# Patient Record
Sex: Female | Born: 1945 | Race: White | Hispanic: No | Smoking: Never smoker
Health system: Southern US, Community
[De-identification: ages and names within clinical notes are randomized; demographics above are authoritative.]

## PROBLEM LIST (undated history)

## (undated) DIAGNOSIS — C801 Malignant (primary) neoplasm, unspecified: Secondary | ICD-10-CM

## (undated) DIAGNOSIS — I10 Essential (primary) hypertension: Secondary | ICD-10-CM

## (undated) HISTORY — DX: Malignant (primary) neoplasm, unspecified: C80.1

## (undated) HISTORY — DX: Essential (primary) hypertension: I10

---

## 2012-07-13 ENCOUNTER — Ambulatory Visit (INDEPENDENT_AMBULATORY_CARE_PROVIDER_SITE_OTHER): Payer: Medicare Other | Admitting: Physician Assistant

## 2012-07-13 ENCOUNTER — Ambulatory Visit: Payer: Self-pay | Admitting: Physician Assistant

## 2012-07-13 ENCOUNTER — Encounter: Payer: Self-pay | Admitting: Physician Assistant

## 2012-07-13 VITALS — BP 152/96 | HR 84 | Temp 97.1°F | Resp 16 | Ht 59.75 in | Wt 173.0 lb

## 2012-07-13 DIAGNOSIS — I10 Essential (primary) hypertension: Secondary | ICD-10-CM

## 2012-07-13 LAB — BASIC METABOLIC PANEL WITH GFR
BUN: 17 mg/dL (ref 6–23)
CO2: 27 mEq/L (ref 19–32)
Calcium: 10 mg/dL (ref 8.4–10.5)
Creat: 0.71 mg/dL (ref 0.50–1.10)
GFR, Est African American: >89 mL/min
Glucose, Bld: 154 mg/dL — ABNORMAL HIGH (ref 70–99)

## 2012-07-13 MED ORDER — LISINOPRIL-HYDROCHLOROTHIAZIDE 20-25 MG PO TABS: 1.0000 | ORAL_TABLET | Freq: Every day | ORAL | Status: DC

## 2012-07-13 MED ORDER — NEBIVOLOL HCL 5 MG PO TABS: 5.0000 mg | ORAL_TABLET | Freq: Every day | ORAL | Status: DC

## 2012-07-13 MED ORDER — AMLODIPINE BESYLATE 10 MG PO TABS: 10.0000 mg | ORAL_TABLET | Freq: Every day | ORAL | Status: DC

## 2012-07-13 NOTE — Progress Notes (Signed)
   Patient ID: Evelyn Riley MRN: [id number], DOB: 1945-03-22, 67 y.o. Date of Encounter: 07/13/2012, 5:26 PM    Chief Complaint:  Chief Complaint  Patient presents with  . Medication Refill    BP check     HPI: 67 y.o. year old female here for f/u. Her LOV here was 04/2010. She says she was living in [place], [place] for a while-had care while there. Has continued on same BP meds as she was on at LOV here. LOV there was about 6 months ago. Has had no BP check anywhere since then.    Home Meds: See attached medication section for any medications that were entered at today's visit. The computer does not put those onto this list.The following list is a list of meds entered prior to today's visit.   No current outpatient prescriptions on file prior to visit.   No current facility-administered medications on file prior to visit.    Allergies: No Known Allergies    Review of Systems: See HPI for pertinent ROS. All other ROS negative.    Physical Exam: Blood pressure 152/96, pulse 84, temperature 97.1 F (36.2 C), temperature source Oral, resp. rate 16, height 4' 11.75" (1.518 m), weight 173 lb (78.472 kg)., Body mass index is 34.05 kg/(m^2). General: WNWD WF. ANAD. Neck: No Carotid Bruits. HEART: Reg Rhythm. No murmur, gallop. Lungs: Clear Extrem: No LE Edema.     ASSESSMENT AND PLAN:  67 y.o. year old female with  1. Hypertension BP is suboptimal. Will cont current meds and add Bystolic to them. Will check lab. - nebivolol (BYSTOLIC) 5 MG tablet; Take 1 tablet (5 mg total) by mouth daily.  Dispense: 30 tablet; Refill: 11 - amLODipine (NORVASC) 10 MG tablet; Take 1 tablet (10 mg total) by mouth daily.  Dispense: 30 tablet; Refill: 11 - lisinopril-hydrochlorothiazide (PRINZIDE,ZESTORETIC) 20-25 MG per tablet; Take 1 tablet by mouth daily.  Dispense: 30 tablet; Refill: 11 - BASIC METABOLIC PANEL WITH GFR  2. Family h/o Cardiovascular disease: Father with CAD in 77s. Mother with CVA at  41.  3. HLD: Discussed again today. She still refuses any cholesterol med at all. Therefore refuses to recheck chol lab 4. Still refuses all preventive care as well (Pap, Mammogram, Colonoscopy etc.)   F/U OV 2-3 weeks to recheck BP after med change.  Murray Hodgkins [place], [place], [facility] 07/13/2012 5:26 PM

## 2012-07-24 ENCOUNTER — Encounter: Payer: Self-pay | Admitting: Family Medicine

## 2013-07-23 ENCOUNTER — Other Ambulatory Visit: Payer: Self-pay | Admitting: Physician Assistant

## 2013-07-24 NOTE — Telephone Encounter (Signed)
Refill appropriate and filled per protocol. 

## 2014-02-19 ENCOUNTER — Other Ambulatory Visit: Payer: Self-pay | Admitting: Physician Assistant

## 2014-02-19 ENCOUNTER — Encounter: Payer: Self-pay | Admitting: Family Medicine

## 2014-02-19 NOTE — Telephone Encounter (Signed)
Medication refill for one time only.  Patient needs to be seen.  Letter sent for patient to call and schedule 

## 2017-04-04 ENCOUNTER — Emergency Department (HOSPITAL_COMMUNITY): Payer: Medicare Other

## 2017-04-04 ENCOUNTER — Other Ambulatory Visit: Payer: Self-pay

## 2017-04-04 ENCOUNTER — Encounter (HOSPITAL_COMMUNITY): Payer: Self-pay | Admitting: Radiology

## 2017-04-04 ENCOUNTER — Inpatient Hospital Stay (HOSPITAL_COMMUNITY): Payer: Medicare Other

## 2017-04-04 ENCOUNTER — Inpatient Hospital Stay (HOSPITAL_COMMUNITY)
Admission: EM | Admit: 2017-04-04 | Discharge: 2017-04-07 | DRG: 292 | Disposition: A | Discharge status: Home Health | Payer: Medicare Other | Attending: Internal Medicine | Admitting: Internal Medicine

## 2017-04-04 ENCOUNTER — Emergency Department (HOSPITAL_BASED_OUTPATIENT_CLINIC_OR_DEPARTMENT_OTHER)
Admit: 2017-04-04 | Discharge: 2017-04-04 | Disposition: A | Discharge status: Home / Self Care | Payer: Medicare Other | Attending: Emergency Medicine | Admitting: Emergency Medicine

## 2017-04-04 DIAGNOSIS — C7951 Secondary malignant neoplasm of bone: Secondary | ICD-10-CM | POA: Diagnosis present

## 2017-04-04 DIAGNOSIS — R609 Edema, unspecified: Secondary | ICD-10-CM

## 2017-04-04 DIAGNOSIS — C50011 Malignant neoplasm of nipple and areola, right female breast: Secondary | ICD-10-CM | POA: Diagnosis present

## 2017-04-04 DIAGNOSIS — I5041 Acute combined systolic (congestive) and diastolic (congestive) heart failure: Secondary | ICD-10-CM | POA: Diagnosis not present

## 2017-04-04 DIAGNOSIS — E876 Hypokalemia: Secondary | ICD-10-CM | POA: Diagnosis present

## 2017-04-04 DIAGNOSIS — Z515 Encounter for palliative care: Secondary | ICD-10-CM | POA: Diagnosis not present

## 2017-04-04 DIAGNOSIS — Z7189 Other specified counseling: Secondary | ICD-10-CM

## 2017-04-04 DIAGNOSIS — I255 Ischemic cardiomyopathy: Secondary | ICD-10-CM | POA: Diagnosis present

## 2017-04-04 DIAGNOSIS — I081 Rheumatic disorders of both mitral and tricuspid valves: Secondary | ICD-10-CM | POA: Diagnosis present

## 2017-04-04 DIAGNOSIS — I110 Hypertensive heart disease with heart failure: Secondary | ICD-10-CM | POA: Diagnosis present

## 2017-04-04 DIAGNOSIS — J181 Lobar pneumonia, unspecified organism: Secondary | ICD-10-CM | POA: Diagnosis not present

## 2017-04-04 DIAGNOSIS — R0602 Shortness of breath: Secondary | ICD-10-CM | POA: Diagnosis not present

## 2017-04-04 DIAGNOSIS — F329 Major depressive disorder, single episode, unspecified: Secondary | ICD-10-CM | POA: Diagnosis present

## 2017-04-04 DIAGNOSIS — I5043 Acute on chronic combined systolic (congestive) and diastolic (congestive) heart failure: Secondary | ICD-10-CM | POA: Diagnosis not present

## 2017-04-04 DIAGNOSIS — N6310 Unspecified lump in the right breast, unspecified quadrant: Secondary | ICD-10-CM | POA: Diagnosis not present

## 2017-04-04 DIAGNOSIS — C773 Secondary and unspecified malignant neoplasm of axilla and upper limb lymph nodes: Secondary | ICD-10-CM | POA: Diagnosis present

## 2017-04-04 DIAGNOSIS — C781 Secondary malignant neoplasm of mediastinum: Secondary | ICD-10-CM | POA: Diagnosis present

## 2017-04-04 DIAGNOSIS — I509 Heart failure, unspecified: Secondary | ICD-10-CM | POA: Diagnosis not present

## 2017-04-04 DIAGNOSIS — L8990 Pressure ulcer of unspecified site, unspecified stage: Secondary | ICD-10-CM | POA: Diagnosis present

## 2017-04-04 DIAGNOSIS — Z8249 Family history of ischemic heart disease and other diseases of the circulatory system: Secondary | ICD-10-CM | POA: Diagnosis not present

## 2017-04-04 DIAGNOSIS — Z66 Do not resuscitate: Secondary | ICD-10-CM | POA: Diagnosis present

## 2017-04-04 DIAGNOSIS — Z9114 Patient's other noncompliance with medication regimen: Secondary | ICD-10-CM

## 2017-04-04 DIAGNOSIS — F419 Anxiety disorder, unspecified: Secondary | ICD-10-CM | POA: Diagnosis not present

## 2017-04-04 DIAGNOSIS — I2510 Atherosclerotic heart disease of native coronary artery without angina pectoris: Secondary | ICD-10-CM | POA: Diagnosis present

## 2017-04-04 DIAGNOSIS — N63 Unspecified lump in unspecified breast: Secondary | ICD-10-CM

## 2017-04-04 DIAGNOSIS — I340 Nonrheumatic mitral (valve) insufficiency: Secondary | ICD-10-CM | POA: Diagnosis not present

## 2017-04-04 DIAGNOSIS — I50811 Acute right heart failure: Secondary | ICD-10-CM

## 2017-04-04 LAB — CBC WITH DIFFERENTIAL/PLATELET
Basophils Absolute: 0 10*3/uL (ref 0.0–0.1)
Basophils Relative: 1 %
EOS ABS: 0.1 10*3/uL (ref 0.0–0.7)
Eosinophils Relative: 1 %
HCT: 46.8 % — ABNORMAL HIGH (ref 36.0–46.0)
HEMOGLOBIN: 15.4 g/dL — AB (ref 12.0–15.0)
LYMPHS ABS: 0.7 10*3/uL (ref 0.7–4.0)
LYMPHS PCT: 12 %
MCH: 30.9 pg (ref 26.0–34.0)
MCHC: 32.9 g/dL (ref 30.0–36.0)
MCV: 94 fL (ref 78.0–100.0)
Monocytes Absolute: 0.7 10*3/uL (ref 0.1–1.0)
Monocytes Relative: 11 %
NEUTROS ABS: 4.8 10*3/uL (ref 1.7–7.7)
NEUTROS PCT: 75 %
Platelets: 217 10*3/uL (ref 150–400)
RBC: 4.98 MIL/uL (ref 3.87–5.11)
RDW: 15.8 % — ABNORMAL HIGH (ref 11.5–15.5)
WBC: 6.4 10*3/uL (ref 4.0–10.5)

## 2017-04-04 LAB — URINALYSIS, ROUTINE W REFLEX MICROSCOPIC
BILIRUBIN URINE: NEGATIVE
Glucose, UA: NEGATIVE mg/dL
Hgb urine dipstick: NEGATIVE
KETONES UR: NEGATIVE mg/dL
LEUKOCYTES UA: NEGATIVE
Nitrite: NEGATIVE
PROTEIN: 100 mg/dL — AB
SPECIFIC GRAVITY, URINE: 1.027 (ref 1.005–1.030)
pH: 5 (ref 5.0–8.0)

## 2017-04-04 LAB — HEPATIC FUNCTION PANEL
ALK PHOS: 124 U/L (ref 38–126)
ALT: 22 U/L (ref 14–54)
AST: 26 U/L (ref 15–41)
Albumin: 3.2 g/dL — ABNORMAL LOW (ref 3.5–5.0)
BILIRUBIN INDIRECT: 2 mg/dL — AB (ref 0.3–0.9)
Bilirubin, Direct: 0.6 mg/dL — ABNORMAL HIGH (ref 0.1–0.5)
TOTAL PROTEIN: 7 g/dL (ref 6.5–8.1)
Total Bilirubin: 2.6 mg/dL — ABNORMAL HIGH (ref 0.3–1.2)

## 2017-04-04 LAB — BASIC METABOLIC PANEL
Anion gap: 12 (ref 5–15)
BUN: 10 mg/dL (ref 6–20)
CHLORIDE: 103 mmol/L (ref 101–111)
CO2: 25 mmol/L (ref 22–32)
Calcium: 9.2 mg/dL (ref 8.9–10.3)
Creatinine, Ser: 0.64 mg/dL (ref 0.44–1.00)
GFR calc Af Amer: >60 mL/min (ref >60)
GFR calc non Af Amer: >60 mL/min (ref >60)
Glucose, Bld: 136 mg/dL — ABNORMAL HIGH (ref 65–99)
POTASSIUM: 3.1 mmol/L — AB (ref 3.5–5.1)
SODIUM: 140 mmol/L (ref 135–145)

## 2017-04-04 LAB — BRAIN NATRIURETIC PEPTIDE: B Natriuretic Peptide: 1060.2 pg/mL — ABNORMAL HIGH (ref 0.0–100.0)

## 2017-04-04 MED ORDER — SODIUM CHLORIDE 0.9% FLUSH
3.0000 mL | Freq: Two times a day (BID) | INTRAVENOUS | Status: DC
  Administered 2017-04-05 – 2017-04-07 (×6): 3 mL via INTRAVENOUS

## 2017-04-04 MED ORDER — FUROSEMIDE 10 MG/ML IJ SOLN
40.0000 mg | Freq: Two times a day (BID) | INTRAMUSCULAR | Status: DC
  Administered 2017-04-05 – 2017-04-06 (×3): 40 mg via INTRAVENOUS
  Filled 2017-04-04 (×3): qty 4

## 2017-04-04 MED ORDER — SODIUM CHLORIDE 0.9% FLUSH: 3.0000 mL | INTRAVENOUS | Status: DC | PRN

## 2017-04-04 MED ORDER — ENOXAPARIN SODIUM 40 MG/0.4ML ~~LOC~~ SOLN
40.0000 mg | SUBCUTANEOUS | Status: DC
  Administered 2017-04-05 – 2017-04-07 (×3): 40 mg via SUBCUTANEOUS
  Filled 2017-04-04 (×3): qty 0.4

## 2017-04-04 MED ORDER — ACETAMINOPHEN 325 MG PO TABS
650.0000 mg | ORAL_TABLET | ORAL | Status: DC | PRN
  Administered 2017-04-05: 650 mg via ORAL
  Filled 2017-04-04: qty 2

## 2017-04-04 MED ORDER — POTASSIUM CHLORIDE CRYS ER 20 MEQ PO TBCR
20.0000 meq | EXTENDED_RELEASE_TABLET | Freq: Two times a day (BID) | ORAL | Status: DC
  Administered 2017-04-05 (×3): 20 meq via ORAL
  Filled 2017-04-04 (×3): qty 1

## 2017-04-04 MED ORDER — IRBESARTAN 75 MG PO TABS
75.0000 mg | ORAL_TABLET | Freq: Every day | ORAL | Status: DC
  Administered 2017-04-05 – 2017-04-07 (×3): 75 mg via ORAL
  Filled 2017-04-04 (×3): qty 1

## 2017-04-04 MED ORDER — IOPAMIDOL (ISOVUE-370) INJECTION 76%
INTRAVENOUS | Status: AC
  Administered 2017-04-04: 100 mL
  Filled 2017-04-04: qty 100

## 2017-04-04 MED ORDER — ASPIRIN EC 81 MG PO TBEC
81.0000 mg | DELAYED_RELEASE_TABLET | Freq: Every day | ORAL | Status: DC
  Administered 2017-04-05 – 2017-04-07 (×3): 81 mg via ORAL
  Filled 2017-04-04 (×3): qty 1

## 2017-04-04 MED ORDER — METOPROLOL SUCCINATE ER 25 MG PO TB24
25.0000 mg | ORAL_TABLET | Freq: Every day | ORAL | Status: DC
  Administered 2017-04-05 – 2017-04-07 (×3): 25 mg via ORAL
  Filled 2017-04-04 (×3): qty 1

## 2017-04-04 MED ORDER — POTASSIUM CHLORIDE CRYS ER 20 MEQ PO TBCR
40.0000 meq | EXTENDED_RELEASE_TABLET | Freq: Once | ORAL | Status: AC
  Administered 2017-04-04: 40 meq via ORAL
  Filled 2017-04-04: qty 2

## 2017-04-04 MED ORDER — MAGNESIUM SULFATE 2 GM/50ML IV SOLN
2.0000 g | Freq: Once | INTRAVENOUS | Status: AC
  Administered 2017-04-05: 2 g via INTRAVENOUS
  Filled 2017-04-04: qty 50

## 2017-04-04 MED ORDER — ONDANSETRON HCL 4 MG/2ML IJ SOLN: 4.0000 mg | Freq: Four times a day (QID) | INTRAMUSCULAR | Status: DC | PRN

## 2017-04-04 MED ORDER — FUROSEMIDE 10 MG/ML IJ SOLN
20.0000 mg | Freq: Once | INTRAMUSCULAR | Status: AC
  Administered 2017-04-04: 20 mg via INTRAVENOUS
  Filled 2017-04-04: qty 2

## 2017-04-04 MED ORDER — SODIUM CHLORIDE 0.9 % IV SOLN: 250.0000 mL | INTRAVENOUS | Status: DC | PRN

## 2017-04-04 MED ORDER — ONDANSETRON HCL 4 MG/2ML IJ SOLN
4.0000 mg | Freq: Once | INTRAMUSCULAR | Status: AC
  Administered 2017-04-04: 4 mg via INTRAVENOUS
  Filled 2017-04-04: qty 2

## 2017-04-04 NOTE — Progress Notes (Signed)
Bilateral lower extremity venous duplex has been completed. Negative for obvious evidence of DVT. Results were given to [facility].  04/04/17 4:03 PM Carlos Levering RVT

## 2017-04-04 NOTE — ED Provider Notes (Signed)
Patient placed in Quick Look pathway, seen and evaluated   Chief Complaint: BLE swelling x2 months  HPI:   Pt and her son report that she's had BLE swelling for "a while" but never as bad as the last 2 months, when it's been getting more erythematous and the skin has been opening up and weeping a yellowish clear fluid. She denies pain in the legs, denies CP/SOB, fevers, or warmth to the legs. She hasn't been to a doctor in "a while" so she has no known medical problems aside from HTN, denies being previously diagnosed with CHF or DM2 but states she isn't sure since it's been so long since she was seen by a doctor.   ROS: +LE swelling, +BLE erythema and weeping, no leg pain, no CP, SOB, fevers, or warmth of the legs  Physical Exam:   Gen: No distress  Neuro: Awake and Alert  Skin: Warm    Focused Exam: BLE with 3+ pitting edema, somewhat brawny skin to LLE with scant erythema, RLE with weeping openings in the leg, and +erythema, slightly warm to touch, nontender to either leg   Initiation of care has begun. The patient has been counseled on the process, plan, and necessity for staying for the completion/evaluation, and the remainder of the medical screening examination    [address] [place], [place] 04/04/17 1412    Hayden Rasmussen, MD 04/06/17 1141

## 2017-04-04 NOTE — ED Triage Notes (Addendum)
Pt arrives to ED with son with extreme lower leg swelling into feet worse on the right, pt had redness and weeping. Pt states she "hasnt seen a doctor in years".

## 2017-04-04 NOTE — ED Provider Notes (Signed)
[facility] EMERGENCY DEPARTMENT Provider Note   CSN: [id number] Arrival date & time: 04/04/17  1332     History   Chief Complaint Chief Complaint  Patient presents with  . Leg Swelling    HPI Evelyn Riley is a 72 y.o. female.  Patient with history of hypertension, noncompliant with medications or routine health care --brought in by son with complaint of bilateral lower extremity edema.  Edema has been getting worse since about Christmas.  Several days ago son noted that the patient had draining from her legs.  No fevers, nausea, vomiting, diarrhea.  She denies any pain.  She has shortness of breath at times but states it is not particularly worse recently.  She sleeps sitting up and states that she has done this for a long time.  Son is concerned because the patient does not address her health issues and he also feels that she has been very depressed for some time does not name but sit at home.        Past Medical History:  Diagnosis Date  . Hypertension     Patient Active Problem List   Diagnosis Date Noted  . Hypertension     The histories are not reviewed yet. Please review them in the "History" navigator section and refresh this Dinosaur.  OB History    No data available       Home Medications    Prior to Admission medications   Medication Sig Start Date End Date Taking? Authorizing Provider  ibuprofen (ADVIL,MOTRIN) 200 MG tablet Take 400 mg by mouth every 6 (six) hours as needed.   Yes [provider]  amLODipine (NORVASC) 10 MG tablet TAKE ONE TABLET BY MOUTH ONCE DAILY Patient not taking: Reported on 04/04/2017 02/19/14   Orlena Sheldon, PA-C  lisinopril-hydrochlorothiazide (PRINZIDE,ZESTORETIC) 20-25 MG per tablet TAKE ONE TABLET BY MOUTH ONCE DAILY Patient not taking: Reported on 04/04/2017 02/19/14   Dena Billet B, PA-C  nebivolol (BYSTOLIC) 5 MG tablet Take 1 tablet (5 mg total) by mouth daily. Patient not taking: Reported on  04/04/2017 07/13/12   Orlena Sheldon, PA-C    Family History No family history on file.  Social History Social History   Tobacco Use  . Smoking status: Never Smoker  . Smokeless tobacco: Never Used  Substance Use Topics  . Alcohol use: No  . Drug use: Yes     Allergies   Patient has no known allergies.   Review of Systems Review of Systems  Constitutional: Negative for diaphoresis and fever.  HENT: Negative for rhinorrhea and sore throat.   Eyes: Negative for redness.  Respiratory: Positive for shortness of breath (intermittent). Negative for cough.   Cardiovascular: Positive for leg swelling. Negative for chest pain and palpitations.  Gastrointestinal: Negative for abdominal pain, diarrhea, nausea and vomiting.  Genitourinary: Negative for dysuria.  Musculoskeletal: Negative for back pain, myalgias and neck pain.  Skin: Negative for rash.  Neurological: Negative for syncope, light-headedness and headaches.  Psychiatric/Behavioral: The patient is not nervous/anxious.      Physical Exam Updated Vital Signs BP (!) 185/105   Pulse 91   Temp 98.4 F (36.9 C) (Oral)   Resp 17   Ht 5\' 1"  (1.549 m)   SpO2 96%   BMI 32.69 kg/m   Physical Exam  Constitutional: She appears well-developed and well-nourished.  HENT:  Head: Normocephalic and atraumatic.  Mouth/Throat: Oropharynx is clear and moist.  Eyes: Conjunctivae are normal. Right eye exhibits no  discharge. Left eye exhibits no discharge.  Neck: Normal range of motion. Neck supple.  Cardiovascular: Normal rate and regular rhythm.  No murmur heard. Pulmonary/Chest: Effort normal and breath sounds normal. No respiratory distress. She has no wheezes.  Abdominal: Soft. There is no tenderness. There is no rebound and no guarding.  Musculoskeletal: She exhibits edema. She exhibits no tenderness.  3-4+ edema bilateral lower extremities to the knees with active serous drainage.  Skin is erythematous but not warm and well  demarcated and I have low suspicion for cellulitis.  No purulent drainage.  Neurological: She is alert.  Skin: Skin is warm and dry.  Psychiatric: She has a normal mood and affect.  Nursing note and vitals reviewed.    ED Treatments / Results  Labs (all labs ordered are listed, but only abnormal results are displayed) Labs Reviewed  CBC WITH DIFFERENTIAL/PLATELET - Abnormal; Notable for the following components:      Result Value   Hemoglobin 15.4 (*)    HCT 46.8 (*)    RDW 15.8 (*)    All other components within normal limits  BASIC METABOLIC PANEL - Abnormal; Notable for the following components:   Potassium 3.1 (*)    Glucose, Bld 136 (*)    All other components within normal limits  HEPATIC FUNCTION PANEL - Abnormal; Notable for the following components:   Albumin 3.2 (*)    Total Bilirubin 2.6 (*)    Bilirubin, Direct 0.6 (*)    Indirect Bilirubin 2.0 (*)    All other components within normal limits  BRAIN NATRIURETIC PEPTIDE - Abnormal; Notable for the following components:   B Natriuretic Peptide 1,060.2 (*)    All other components within normal limits  URINALYSIS, ROUTINE W REFLEX MICROSCOPIC - Abnormal; Notable for the following components:   Protein, ur 100 (*)    Bacteria, UA RARE (*)    Squamous Epithelial / LPF 0-5 (*)    All other components within normal limits  HEPATIC FUNCTION PANEL  BRAIN NATRIURETIC PEPTIDE  TROPONIN I    EKG  EKG Interpretation None       Radiology Vas [place] Lower Extremity Venous (dvt) (only [facility])  Result Date: 04/04/2017  Lower Venous Study Indication: Edema. Examination Guidelines: A complete evaluation includes B-mode imaging, spectral doppler, color doppler, and power doppler as needed of all accessible portions of each vessel. Bilateral testing is considered an integral part of a complete examination. Limited examinations for reoccurring indications may be performed as noted. The reflux portion of the exam is performed with  the patient in reverse Trendelenburg.  Limitations: Poor ultrasound/tissue interface.  Right Venous Findings: +---------+---------------+---------+-----------+----------+-------+          CompressibilityPhasicitySpontaneityPropertiesSummary +---------+---------------+---------+-----------+----------+-------+ CFV      Full           Yes      Yes                          +---------+---------------+---------+-----------+----------+-------+ FV Prox  Full                                                 +---------+---------------+---------+-----------+----------+-------+ FV Mid   Full                                                 +---------+---------------+---------+-----------+----------+-------+  FV DistalFull                                                 +---------+---------------+---------+-----------+----------+-------+ PFV      Full                                                 +---------+---------------+---------+-----------+----------+-------+ POP      Full           Yes      Yes                          +---------+---------------+---------+-----------+----------+-------+ PTV      Full                                                 +---------+---------------+---------+-----------+----------+-------+ PERO     Full                                                 +---------+---------------+---------+-----------+----------+-------+  Left Venous Findings: +---------+---------------+---------+-----------+----------+-------------------+          CompressibilityPhasicitySpontaneityPropertiesSummary             +---------+---------------+---------+-----------+----------+-------------------+ CFV      Full           Yes      Yes                                      +---------+---------------+---------+-----------+----------+-------------------+ FV Prox  Full                                                              +---------+---------------+---------+-----------+----------+-------------------+ FV Mid   Full                                                             +---------+---------------+---------+-----------+----------+-------------------+ FV DistalFull                                                             +---------+---------------+---------+-----------+----------+-------------------+ PFV      Full                                                             +---------+---------------+---------+-----------+----------+-------------------+  POP      Full           Yes      Yes                                      +---------+---------------+---------+-----------+----------+-------------------+ PTV                                                   Unable to visualize +---------+---------------+---------+-----------+----------+-------------------+ PERO                                                  Unable to visualize +---------+---------------+---------+-----------+----------+-------------------+    Final Interpretation: Right: There is no evidence of deep vein thrombosis in the lower extremity.There is no evidence of superficial venous thrombosis. No cystic structure found in the popliteal fossa. Left: There is no evidence of deep vein thrombosis in the lower extremity.There is no evidence of deep vein thrombosis in the lower extremity. However, portions of this examination were limited- see technologist comments above. No cystic structure found in the popliteal fossa.  *See table(s) above for measurements and observations. Electronically signed by Deitra Mayo on 04/04/2017 at 5:01:10 PM.   Final   Procedures Procedures (including critical care time)  Medications Ordered in ED Medications  iopamidol (ISOVUE-370) 76 % injection (not administered)  potassium chloride SA (K-DUR,KLOR-CON) CR tablet 40 mEq (40 mEq Oral Given 04/04/17 1827)  furosemide (LASIX)  injection 20 mg (20 mg Intravenous Given 04/04/17 2058)  ondansetron (ZOFRAN) injection 4 mg (4 mg Intravenous Given 04/04/17 2058)     Initial Impression / Assessment and Plan / ED Course  I have reviewed the triage vital signs and the nursing notes.  Pertinent labs & imaging results that were available during my care of the patient were reviewed by me and considered in my medical decision making (see chart for details).     Patient seen and examined. Work-up initiated. Medications ordered. Patient discussed with Dr. Laverta Baltimore. Patient is apprehensive about staying for further workup.  After discussion, she agrees to blood work.  Vital signs reviewed and are as follows: BP (!) 145/130 (BP Location: Right Arm)   Pulse (!) 108   Temp 99.1 F (37.3 C) (Oral)   Resp 14   Ht 5\' 1"  (1.549 m)   SpO2 97%   BMI 32.69 kg/m   Reviewed lab work with patient and son at bedside.  We discussed findings of chest x-ray and BNP.  Strongly advised that she agreed to admission to hospital for further workup of possible new onset congestive heart failure.  Patient agrees to stay.  I spoke with Dr. Posey Pronto who will see patient.  CT ordered to further evaluate chest x-ray findings.  Lasix ordered.  Potassium given.  10:59 PM Spoke with radiologist regarding CT findings, patient with likely breast CA with lymphangitic spread and bony involvement.   I updated patient and family at bedside. Dr. Posey Pronto present.    Final Clinical Impressions(s) / ED Diagnoses   Final diagnoses:  Acute right-sided congestive heart failure (HCC)  Hypokalemia  Mass of right breast   Patient with bilateral  lower extremity edema, elevated BNP, abnormal findings on chest x-ray.  She will need workup.   ED Discharge Orders    None       Carlisle Cater, Hershal Coria 04/04/17 2301    Margette Fast, MD 04/04/17 [phone]

## 2017-04-04 NOTE — ED Notes (Signed)
Pt's SpO2 noted to be varying between 80-94% with a good waveform the bulk of the time. Pt continues to deny SOB. Placed on 2Lpm via [place] for support.

## 2017-04-04 NOTE — ED Notes (Signed)
Patient transported to CT 

## 2017-04-04 NOTE — H&P (Signed)
[facility] History and Physical   Patient: Evelyn Riley QVZ:[id number]   PCP: Orlena Sheldon, PA-C DOB: 1946-01-21   DOA: 04/04/2017   DOS: 04/04/2017   DOS: the patient was seen and examined on 04/04/2017  Patient coming from: The patient is coming from home.  Chief Complaint: leg swelling  HPI: Evelyn Riley is a 72 y.o. female with no known past medical history . Patient was brought in by family due to concern regarding swelling of the leg. This leg swelling has been ongoing for last few months progressively worsening.  Patient also has mild shortness of breath.  Had an episode of nausea and vomiting in the ER.  Denies any further nausea at the time of my evaluation.  No abdominal pain.  No fever no chills.  No diarrhea no burning urination. Does not take any medication does not see any PCP at her baseline.  Denies any alcohol or drug abuse. Denies any smoking as well. Per son she has lost a significant amount of weight.  ED Course: Shortness of breath as well as leg swelling was diagnosed with CHF and patient was given IV Lasix. 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  Patient was referred for admission.  At her baseline ambulates with support And is independent for most of her ADL; manages her medication on her own.  Review of Systems: as mentioned in the history of present illness.  All other systems reviewed and are negative.  Past Medical History:  Diagnosis Date  . Hypertension    History reviewed. No pertinent surgical history. Social History:  reports that  has never smoked. she has never used smokeless tobacco. She reports that she does not drink alcohol or use drugs.  No Known Allergies Family History  Problem Relation Age of Onset  . Hypertension Mother      Prior to Admission medications   Medication Sig  Start Date End Date Taking? Authorizing Provider  ibuprofen (ADVIL,MOTRIN) 200 MG tablet Take 400 mg by mouth every 6 (six) hours as needed.   Yes [provider]    Physical Exam: Vitals:   04/04/17 2115 04/04/17 2145 04/04/17 2215 04/04/17 2300  BP:    (!) 166/87  Pulse: (!) 104 (!) 110 93 (!) 103  Resp: 17 16 (!) 29 (!) 23  Temp:      TempSrc:      SpO2: 96% 92% 93% 90%  Height:        General: Alert, Awake and Oriented to Time, Place and Person. Appear in moderate distress, affect appropriate Eyes: PERRL, Conjunctiva normal ENT: Oral Mucosa clear moist. Neck: positive JVD, no Abnormal Mass Or lumps Cardiovascular: S1 and S2 Present, no Murmur, Peripheral Pulses Present Respiratory: normal respiratory effort, Bilateral Air entry equal and Decreased, no use of accessory muscle, bilateral basal Crackles, no wheezes Abdomen: Bowel Sound present, Soft and no tenderness, no hernia Skin: no redness, no Rash, no induration Extremities: bilateral Pedal edema, no calf tenderness Neurologic: Grossly no focal neuro deficit. Bilaterally Equal motor strength  Labs on Admission:  CBC: Recent Labs  Lab 04/04/17 1410  WBC 6.4  NEUTROABS 4.8  HGB 15.4*  HCT 46.8*  MCV 94.0  PLT 329   Basic Metabolic Panel: Recent Labs  Lab 04/04/17 1410  NA 140  K 3.1*  CL 103  CO2 25  GLUCOSE 136*  BUN  10  CREATININE 0.64  CALCIUM 9.2   GFR: CrCl cannot be calculated (Unknown ideal weight.). Liver Function Tests: Recent Labs  Lab 04/04/17 1410  AST 26  ALT 22  ALKPHOS 124  BILITOT 2.6*  PROT 7.0  ALBUMIN 3.2*   No results for input(s): LIPASE, AMYLASE in the last 168 hours. No results for input(s): AMMONIA in the last 168 hours. Coagulation Profile: No results for input(s): INR, PROTIME in the last 168 hours. Cardiac Enzymes: No results for input(s): CKTOTAL, CKMB, CKMBINDEX, TROPONINI in the last 168 hours. BNP (last 3 results) No results for input(s): PROBNP in  the last 8760 hours. HbA1C: No results for input(s): HGBA1C in the last 72 hours. CBG: No results for input(s): GLUCAP in the last 168 hours. Lipid Profile: No results for input(s): CHOL, HDL, LDLCALC, TRIG, CHOLHDL, LDLDIRECT in the last 72 hours. Thyroid Function Tests: No results for input(s): TSH, T4TOTAL, FREET4, T3FREE, THYROIDAB in the last 72 hours. Anemia Panel: No results for input(s): VITAMINB12, FOLATE, FERRITIN, TIBC, IRON, RETICCTPCT in the last 72 hours. Urine analysis:    Component Value Date/Time   COLORURINE YELLOW 04/04/2017 1841   APPEARANCEUR CLEAR 04/04/2017 1841   LABSPEC 1.027 04/04/2017 1841   PHURINE 5.0 04/04/2017 1841   GLUCOSEU NEGATIVE 04/04/2017 1841   HGBUR NEGATIVE 04/04/2017 1841   BILIRUBINUR NEGATIVE 04/04/2017 [place] NEGATIVE 04/04/2017 1841   PROTEINUR 100 (A) 04/04/2017 1841   NITRITE NEGATIVE 04/04/2017 1841   LEUKOCYTESUR NEGATIVE 04/04/2017 1841    Radiological Exams on Admission: Dg Chest 2 View  Result Date: 04/04/2017 CLINICAL DATA:  Lower extremity edema.  Hypertension. EXAM: CHEST  2 VIEW COMPARISON:  None. FINDINGS: There is consolidation throughout portions of the right middle and lower lobe with small right pleural effusion. The left lung is clear. There is cardiomegaly with pulmonary venous hypertension. There is soft tissue prominence in the right hilum concerning for adenopathy. There is aortic atherosclerosis. There is an old fracture of the right posterolateral seventh rib. No blastic or lytic bone lesions are evident. IMPRESSION: 1. Consolidation throughout much of the right middle lobe as well as in portions of the right lower lobe with right pleural effusion. Suspect pneumonia. 2.  Pulmonary vascular congestion. 3. Prominence of the right hilar region. Question a degree of adenopathy. This finding may well warrant contrast enhanced chest CT to further evaluate. 4.  Aortic atherosclerosis. Aortic Atherosclerosis  (ICD10-I70.0). Electronically Signed   By: Lowella Grip III M.D.   On: 04/04/2017 19:59   Ct Angio Chest Pe W And/or Wo Contrast  Addendum Date: 04/04/2017   ADDENDUM REPORT: 04/04/2017 23:14 ADDENDUM: Lucent and sclerotic appearance of the right first rib, may reflect additional osseous metastatic focus. Electronically Signed   By: Donavan Foil M.D.   On: 04/04/2017 23:14   Result Date: 04/04/2017 CLINICAL DATA:  Shortness of breath for 1 day EXAM: CT ANGIOGRAPHY CHEST WITH CONTRAST TECHNIQUE: Multidetector CT imaging of the chest was performed using the standard protocol during bolus administration of intravenous contrast. Multiplanar CT image reconstructions and MIPs were obtained to evaluate the vascular anatomy. CONTRAST:  110 mL Isovue 370 intravenous COMPARISON:  Radiograph 04/04/2017 FINDINGS: Cardiovascular: Satisfactory opacification of the pulmonary arteries to the segmental level. No evidence of pulmonary embolism. Nonaneurysmal aorta. Moderate aortic atherosclerosis. Mild coronary artery calcification. Cardiomegaly with trace pericardial effusion. Mediastinum/Nodes: Midline trachea. No thyroid mass. Enlarged mediastinal lymph nodes. Right pretracheal lymph node measures 12 mm. Right precarinal lymph node measures 16 mm.  Esophagus within normal limits. Multiple enlarged right axillary and subpectoral lymph nodes, measuring up to 3.5 cm. Lungs/Pleura: Moderate right-sided pleural effusion. Mild atelectasis in the right middle lobe and right lung base. No pneumothorax. Upper Abdomen: Partially visualized hypodensity in the posterior right hepatic lobe. Adrenal glands are within normal limits. Musculoskeletal: Large sub areolar mass in the right breast with suggestion of nipple retraction.Mass measures approximately 5.6 x 2.9 by 7.2 cm. Bones demonstrate lucent lesion in the T1 vertebral body, mixed lucency and sclerosis at T2, T8, T11, and T12. Review of the MIP images confirms the above  findings. IMPRESSION: 1. Negative for acute pulmonary embolus. Cardiomegaly with moderate right-sided pleural effusion. 2. Large right subareolar breast mass concerning for breast carcinoma. Overlying nipple retraction. Multiple enlarged right axillary and subpectoral lymph nodes concerning for metastatic disease. 3. Enlarged mediastinal lymph nodes, suspect for metastatic disease 4. Multiple lucent and mixed lucent and sclerotic lesions within the spine as above concerning for osseous metastatic disease. Critical Value/emergent results were called by telephone at the time of interpretation on 04/04/2017 at 10:50 pm to Dr. Carlisle Cater , who verbally acknowledged these results. Aortic Atherosclerosis (ICD10-I70.0). Electronically Signed: By: Donavan Foil M.D. On: 04/04/2017 22:50   Vas [place] Lower Extremity Venous (dvt) (only [facility])  Result Date: 04/04/2017  Lower Venous Study Indication: Edema. Examination Guidelines: A complete evaluation includes B-mode imaging, spectral doppler, color doppler, and power doppler as needed of all accessible portions of each vessel. Bilateral testing is considered an integral part of a complete examination. Limited examinations for reoccurring indications may be performed as noted. The reflux portion of the exam is performed with the patient in reverse Trendelenburg.  Limitations: Poor ultrasound/tissue interface.  Right Venous Findings: +---------+---------------+---------+-----------+----------+-------+          CompressibilityPhasicitySpontaneityPropertiesSummary +---------+---------------+---------+-----------+----------+-------+ CFV      Full           Yes      Yes                          +---------+---------------+---------+-----------+----------+-------+ FV Prox  Full                                                 +---------+---------------+---------+-----------+----------+-------+ FV Mid   Full                                                  +---------+---------------+---------+-----------+----------+-------+ FV DistalFull                                                 +---------+---------------+---------+-----------+----------+-------+ PFV      Full                                                 +---------+---------------+---------+-----------+----------+-------+ POP      Full           Yes      Yes                          +---------+---------------+---------+-----------+----------+-------+  PTV      Full                                                 +---------+---------------+---------+-----------+----------+-------+ PERO     Full                                                 +---------+---------------+---------+-----------+----------+-------+  Left Venous Findings: +---------+---------------+---------+-----------+----------+-------------------+          CompressibilityPhasicitySpontaneityPropertiesSummary             +---------+---------------+---------+-----------+----------+-------------------+ CFV      Full           Yes      Yes                                      +---------+---------------+---------+-----------+----------+-------------------+ FV Prox  Full                                                             +---------+---------------+---------+-----------+----------+-------------------+ FV Mid   Full                                                             +---------+---------------+---------+-----------+----------+-------------------+ FV DistalFull                                                             +---------+---------------+---------+-----------+----------+-------------------+ PFV      Full                                                             +---------+---------------+---------+-----------+----------+-------------------+ POP      Full           Yes      Yes                                       +---------+---------------+---------+-----------+----------+-------------------+ PTV                                                   Unable to visualize +---------+---------------+---------+-----------+----------+-------------------+ PERO  Unable to visualize +---------+---------------+---------+-----------+----------+-------------------+    Final Interpretation: Right: There is no evidence of deep vein thrombosis in the lower extremity.There is no evidence of superficial venous thrombosis. No cystic structure found in the popliteal fossa. Left: There is no evidence of deep vein thrombosis in the lower extremity.There is no evidence of deep vein thrombosis in the lower extremity. However, portions of this examination were limited- see technologist comments above. No cystic structure found in the popliteal fossa.  *See table(s) above for measurements and observations. Electronically signed by Deitra Mayo on 04/04/2017 at 5:01:10 PM.   Final   Assessment/Plan 1.  Acute CHF. Specificity is not clear but most likely diastolic, right-sided CHF. Echocardiogram ordered. EKG unremarkable for any ischemia. Troponin negative as well. Continue IV Lasix 40 mg twice daily. Added ARB as well as Toprol-XL. Monitor on telemetry. PT OT evaluation.  2.  Right breast lesion. CT scan of the chest is positive for right breast mass with lymphadenopathy as well as involvement of T1 spine. This most likely is consistent with metastatic breast cancer. Discussed with patient as well as family, patient mentions that she does not want any chemotherapy or immunotherapy. Will discuss with oncology tomorrow for further option. Most likely best option would be palliative care given patient's current presentation.  3.  Mild hypokalemia. Will replace daily potassium.  4.  Depression. Patient shows an exhibit signs of depression with weight loss as well as  flat affect. No benefit from the medication. Will monitor overnight and started on probably Zoloft or Paxil tomorrow.  5.  Bilateral leg edema as well as elevated d-dimer. Lower externally Doppler as well as CT scan negative for PE and DVT. Monitor.  Nutrition: cardiac diet DVT Prophylaxis: subcutaneous Heparin  Advance goals of care discussion: full code   Consults: none  Family Communication: family was present at bedside, at the time of interview.  Opportunity was given to ask question and all questions were answered satisfactorily.  Disposition: Admitted as inpatient, telemetry nit. Likely to be discharged home, in 3-4 days.  Author: Berle Mull, MD [facility] Pager: [phone] 04/04/2017  If 7PM-7AM, please contact night-coverage [email or web address] Password [email or web address]

## 2017-04-04 NOTE — ED Notes (Signed)
After ambulating to bathroom in hallway, pt denies SOB, but is obviously having labored breathing, tachypnic, and tachycardic. SpO2 >93%. Max HR observed upon return to room, 125bpm. Breathing slowed upon return to rest.

## 2017-04-04 NOTE — ED Notes (Signed)
Admitting Provider at bedside. 

## 2017-04-04 NOTE — ED Notes (Signed)
Patient transported to X-ray 

## 2017-04-05 ENCOUNTER — Other Ambulatory Visit: Payer: Self-pay

## 2017-04-05 ENCOUNTER — Inpatient Hospital Stay (HOSPITAL_COMMUNITY): Payer: Medicare Other

## 2017-04-05 ENCOUNTER — Encounter (HOSPITAL_COMMUNITY): Payer: Self-pay | Admitting: Internal Medicine

## 2017-04-05 DIAGNOSIS — L8990 Pressure ulcer of unspecified site, unspecified stage: Secondary | ICD-10-CM

## 2017-04-05 DIAGNOSIS — I340 Nonrheumatic mitral (valve) insufficiency: Secondary | ICD-10-CM

## 2017-04-05 DIAGNOSIS — I5041 Acute combined systolic (congestive) and diastolic (congestive) heart failure: Secondary | ICD-10-CM

## 2017-04-05 LAB — BASIC METABOLIC PANEL
Anion gap: 15 (ref 5–15)
BUN: 7 mg/dL (ref 6–20)
CHLORIDE: 101 mmol/L (ref 101–111)
CO2: 26 mmol/L (ref 22–32)
Calcium: 8.9 mg/dL (ref 8.9–10.3)
Creatinine, Ser: 0.68 mg/dL (ref 0.44–1.00)
GFR calc Af Amer: >60 mL/min (ref >60)
GFR calc non Af Amer: >60 mL/min (ref >60)
GLUCOSE: 150 mg/dL — AB (ref 65–99)
POTASSIUM: 3.6 mmol/L (ref 3.5–5.1)
Sodium: 142 mmol/L (ref 135–145)

## 2017-04-05 LAB — CBC
HCT: 44.1 % (ref 36.0–46.0)
HEMOGLOBIN: 14.2 g/dL (ref 12.0–15.0)
MCH: 30.1 pg (ref 26.0–34.0)
MCHC: 32.2 g/dL (ref 30.0–36.0)
MCV: 93.6 fL (ref 78.0–100.0)
Platelets: 223 10*3/uL (ref 150–400)
RBC: 4.71 MIL/uL (ref 3.87–5.11)
RDW: 15.7 % — ABNORMAL HIGH (ref 11.5–15.5)
WBC: 14.4 10*3/uL — AB (ref 4.0–10.5)

## 2017-04-05 LAB — ECHOCARDIOGRAM COMPLETE
Height: 61 in
Weight: 2289.6 oz

## 2017-04-05 LAB — MAGNESIUM: MAGNESIUM: 2.1 mg/dL (ref 1.7–2.4)

## 2017-04-05 NOTE — Progress Notes (Signed)
Entered patient's room to recheck blood pressure and administer medications.   Pt informed of NPO status, and to avoid fast food when she isn't on NPO status, as the [employer] breakfast at bedside is full of sodium and could worsen her condition.  Pt states she doesn't need to be NPO today, as she is refusing the biopsy today. Does not indicate a permanent refusal; just that she would not like to do it today.   Pt's son is at bedside, he expresses frustration with the patient and storms out of the room.   Upon telling the patient I would return to check her BP once things have calmed down, and exiting the room the patient's son indicates that he is POA and would like his paperwork on file. Pt's son was reminded that the patient is c/a/ox4 and is of sound mind therefore he cannot override her wishes; at which point he states that he can get someone to say she isn't. Son is reminded that pt is in fact very aware of the situation, and that it is her right to make these difficult decisions how she wishes.

## 2017-04-05 NOTE — Progress Notes (Signed)
   04/05/17 1300  Clinical Encounter Type  Visited With Patient and family together  Visit Type Spiritual support;Social support;Follow-up  Referral From Nurse  Consult/Referral To Chaplain  Spiritual Encounters  Spiritual Needs Emotional  Stress Factors  Patient Stress Factors Major life changes  Family Stress Factors Major life changes  Chaplain visited with the family as a means to introduce the paperwork for Advanced Directives.  The PT was alert and oriented, PT did not want AD paperwork but DNR.  Chaplain spoke with the Unit Director and make it known that PT wants DNR, doctor will be notified.

## 2017-04-05 NOTE — Progress Notes (Signed)
OT Cancellation Note  Patient Details Name: Evelyn Riley MRN: [id number] DOB: [birth year]   Cancelled Treatment:    Reason Eval/Treat Not Completed: Other (comment). Per conversation with nurse, pt and son are currently processing recent cancer diagnosis very emotional at this time.   Tyrone Schimke OTR/L Pager: [phone]   04/05/2017, 12:53 PM

## 2017-04-05 NOTE — Evaluation (Signed)
Physical Therapy Evaluation & Discharge Patient Details Name: Evelyn Riley MRN: [id number] DOB: March 16, 1945 Today's Date: 04/05/2017   History of Present Illness  Pt is a 72 y.o. female admitted 04/04/17 with BLE swelling; worked up for CHF exacerbation. CT and LE doppler negative for PE and DVT. Chest CT showed R breast mass with T1 spine involvement, consistent with metastatic breast cancer; pt declining cehmo and immunotherapy. PMH includes HTN.    Clinical Impression  Patient evaluated by Physical Therapy with no further acute PT needs identified. PTA, pt indep with mobility and lives alone; son lives nearby and intermittently assist pt with driving. Pt currently indep with ambulation with no apparent balance deficits. Education on energy conservation, fall risk reduction, and importance of continued mobility. Pt states son has access to DME if needed. All education has been completed and the patient has no further questions. Pt anxious to return home. PT is signing off. Thank you for this referral.    Follow Up Recommendations No PT follow up;Supervision - Intermittent    Equipment Recommendations  None recommended by PT    Recommendations for Other Services       Precautions / Restrictions Precautions Precautions: None Restrictions Weight Bearing Restrictions: No      Mobility  Bed Mobility Overal bed mobility: Independent                Transfers Overall transfer level: Independent Equipment used: None                Ambulation/Gait Ambulation/Gait assistance: Independent Ambulation Distance (Feet): 350 Feet Assistive device: None Gait Pattern/deviations: Step-through pattern;Decreased stride length Gait velocity: Decreased Gait velocity interpretation: <1.8 ft/sec, indicative of risk for recurrent falls General Gait Details: Slow, controlled amb indep  Stairs Stairs: Yes Stairs assistance: Modified independent (Device/Increase time) Stair Management:  One rail Right;Backwards Number of Stairs: 10 General stair comments: Simulated ascending steps by high marching with single UE support on R-rail  Wheelchair Mobility    Modified Rankin (Stroke Patients Only)       Balance Overall balance assessment: No apparent balance deficits (not formally assessed)                                           Pertinent Vitals/Pain Pain Assessment: No/denies pain    Home Living Family/patient expects to be discharged to:: Private residence Living Arrangements: Alone Available Help at Discharge: Family;Available PRN/intermittently Type of Home: House Home Access: Stairs to enter Entrance Stairs-Rails: Right Entrance Stairs-Number of Steps: 4 Home Layout: One level   Additional Comments: Son's family lives nearby; has access to DME if needed    Prior Function Level of Independence: Independent         Comments: Drives short distances; son assists with longer trips/errands if needed. Indep with amb and ADLs     Hand Dominance        Extremity/Trunk Assessment   Upper Extremity Assessment Upper Extremity Assessment: Overall WFL for tasks assessed    Lower Extremity Assessment Lower Extremity Assessment: Overall WFL for tasks assessed       Communication   Communication: No difficulties  Cognition Arousal/Alertness: Awake/alert Behavior During Therapy: WFL for tasks assessed/performed Overall Cognitive Status: Within Functional Limits for tasks assessed  General Comments      Exercises     Assessment/Plan    PT Assessment Patent does not need any further PT services  PT Problem List         PT Treatment Interventions      PT Goals (Current goals can be found in the Care Plan section)  Acute Rehab PT Goals PT Goal Formulation: All assessment and education complete, DC therapy    Frequency     Barriers to discharge         Co-evaluation               AM-PAC PT "6 Clicks" Daily Activity  Outcome Measure Difficulty turning over in bed (including adjusting bedclothes, sheets and blankets)?: None Difficulty moving from lying on back to sitting on the side of the bed? : None Difficulty sitting down on and standing up from a chair with arms (e.g., wheelchair, bedside commode, etc,.)?: None Help needed moving to and from a bed to chair (including a wheelchair)?: None Help needed walking in hospital room?: None Help needed climbing 3-5 steps with a railing? : None 6 Click Score: 24    End of Session Equipment Utilized During Treatment: Gait belt Activity Tolerance: Patient tolerated treatment well Patient left: in bed;with call bell/phone within reach Nurse Communication: Mobility status PT Visit Diagnosis: Other abnormalities of gait and mobility (R26.89)    Time: 1651-1710 PT Time Calculation (min) (ACUTE ONLY): 19 min   Charges:   PT Evaluation $PT Eval Moderate Complexity: 1 Mod     PT G Codes:       Mabeline Caras, PT, DPT Acute Rehab Services  Pager: [place] 04/05/2017, 5:14 PM

## 2017-04-05 NOTE — Progress Notes (Signed)
  Request seen for biopsy.  Chart briefly reviewed.  CT images seen = Large right subareolar breast mass concerning for breast carcinoma. Overlying nipple retraction. Multiple enlarged right axillary and subpectoral lymph nodes concerning for metastatic disease. Enlarged mediastinal lymph nodes, suspect for metastatic disease Multiple lucent and mixed lucent and sclerotic lesions within the spine as above concerning for osseous metastatic disease.  Patient with apparent advanced breast cancer.  I had a discussion with the patient and the 2 family members in her room (Son and Daughter [place]).  Her son is encouraging the biopsy because he states "we don't KNOW that it's cancer".  I did explain to him that the findings on the CT were highly concerning for cancer.  The patient states she is well aware of her condition and feels her prognosis is poor.  She does not wish to have a biopsy at this time. She states she "isn't going to do anything with the answer, so why bother".  She is requesting DNR.  She states she wants to keep her current quality of life and she does not wish to be "sick with chemotherapy".  She is asking to go home.  I explained that we do not do these biopsies as an inpatient and if she were to change her mind she could arrange to have the biopsy done as an outpatient and that she can certainly be discharged from our standpoint.  She can resume her diet. I encouraged regular diet instead of heart healthy especially if she is firm on being DNR and enjoying the remainder of her life as much as possible. I explained she could eat anything she wants given the decision she has currently made.  Klover Priestly S Shelbey Spindler PA-C 04/05/2017 1:42 PM

## 2017-04-05 NOTE — Progress Notes (Signed)
Patient resting comfortably during shift report. Denies complaints.  

## 2017-04-05 NOTE — Plan of Care (Signed)
  Health Behavior/Discharge Planning: Ability to manage health-related needs will improve 04/05/2017 0155 - Progressing by Tristan Schroeder, RN   Education: Knowledge of General Education information will improve 04/05/2017 0155 - Progressing by Tristan Schroeder, RN   Clinical Measurements: Respiratory complications will improve 04/05/2017 0155 - Progressing by Tristan Schroeder, RN   Activity: Risk for activity intolerance will decrease 04/05/2017 0155 - Progressing by Tristan Schroeder, RN   Safety: Ability to remain free from injury will improve 04/05/2017 0155 - Progressing by Tristan Schroeder, RN   Skin Integrity: Risk for impaired skin integrity will decrease 04/05/2017 0155 - Progressing by Tristan Schroeder, RN   Cardiac: Ability to achieve and maintain adequate cardiopulmonary perfusion will improve 04/05/2017 0155 - Progressing by Tristan Schroeder, RN

## 2017-04-05 NOTE — Progress Notes (Signed)
Page to Dr Posey Pronto  3e25 [place]: refusing biopsy today, not NPO (ate [employer] from family)did receive lovenox this am. see progress note just entered.

## 2017-04-05 NOTE — Progress Notes (Signed)
Patient requests to seek chaplain assistance to create advanced directives. Would like to be DNR.

## 2017-04-05 NOTE — Consult Note (Addendum)
Cardiology Consultation:   Patient ID: Evelyn Riley; [id number]; 26-Sep-1945   Admit date: 04/04/2017 Date of Consult: 04/05/2017  Primary Care Provider: Orlena Sheldon, PA-C Primary Cardiologist: Dr. Acie Fredrickson Primary Electrophysiologist:  NA   Patient Profile:   Evelyn Riley is a 72 y.o. female with a hx of HTN who is being seen today for the evaluation of CHF with EF of 20% at the request of Dr. Posey Pronto.  History of Present Illness:   [name] has a hx of HTN and was admitted 04/04/17 for lower ext edema.  No prior hx of CAD.  EF 20-25% today and akinesis of the anteroseptal, anterior, and anterolateral Myocardium, G 3 DD, Valve mobility was restricted.  Mild MR, severe TR.   PA PK pressure 47 mmHg.  Na 142, K+ 3.6, Cr. 0.68, mg+ 2.1 BNP 1060 Hgb 14.2, WBC 14.4, pts 223  EKG-no EKG have ordered stat    Troponin none done  CTA 04/06/17 IMPRESSION: 1. Negative for acute pulmonary embolus. Cardiomegaly with moderate right-sided pleural effusion. 2. Large right subareolar breast mass concerning for breast carcinoma. Overlying nipple retraction. Multiple enlarged right axillary and subpectoral lymph nodes concerning for metastatic disease. 3. Enlarged mediastinal lymph nodes, suspect for metastatic disease 4. Multiple lucent and mixed lucent and sclerotic lesions within the spine as above concerning for osseous metastatic disease.  Mild coronary artery calcification. Cardiomegaly with trace pericardial effusion.    No DVT.  On venous dopplers.     EKG:  The EKG was personally reviewed and demonstrates:  Waiting to be done Telemetry:  Telemetry was personally reviewed and demonstrates:  SR , ST and what appears to be ST with wide complex   Currently feeling better no SOb, edema of legs continues. One episode a couple of months ago she had been busy all day and was walking became very SOB and had to rest.  No chest pain and no hx of chest pain.      Past Medical History:  Diagnosis  Date  . Hypertension     History reviewed. No pertinent surgical history.   Home Medications:  Prior to Admission medications   Medication Sig Start Date End Date Taking? Authorizing Provider  ibuprofen (ADVIL,MOTRIN) 200 MG tablet Take 400 mg by mouth every 6 (six) hours as needed.   Yes [provider]    Inpatient Medications: Scheduled Meds: . aspirin EC  81 mg Oral Daily  . enoxaparin (LOVENOX) injection  40 mg Subcutaneous Q24H  . furosemide  40 mg Intravenous BID  . irbesartan  75 mg Oral Daily  . metoprolol succinate  25 mg Oral Daily  . potassium chloride  20 mEq Oral BID  . sodium chloride flush  3 mL Intravenous Q12H   Continuous Infusions: . sodium chloride     PRN Meds: sodium chloride, acetaminophen, ondansetron (ZOFRAN) IV, sodium chloride flush  Allergies:   No Known Allergies  Social History:   Social History   Socioeconomic History  . Marital status: Single    Spouse name: Not on file  . Number of children: Not on file  . Years of education: Not on file  . Highest education level: Not on file  Social Needs  . Financial resource strain: Not on file  . Food insecurity - worry: Not on file  . Food insecurity - inability: Not on file  . Transportation needs - medical: Not on file  . Transportation needs - non-medical: Not on file  Occupational History  . Not  on file  Tobacco Use  . Smoking status: Never Smoker  . Smokeless tobacco: Never Used  Substance and Sexual Activity  . Alcohol use: No  . Drug use: No  . Sexual activity: Not on file  Other Topics Concern  . Not on file  Social History Narrative  . Not on file    Family History:    Family History  Problem Relation Age of Onset  . Hypertension Mother   . Heart disease Father 42       died at 38     ROS:  Please see the history of present illness.  General:no colds or fevers, no weight changes Skin:no rashes or ulcers HEENT:no blurred vision, no congestion CV:see  HPI PUL:see HPI GI:no diarrhea constipation or melena, no indigestion GU:no hematuria, no dysuria MS:no joint pain, no claudication Neuro:no syncope, no lightheadedness Endo:no diabetes, no thyroid disease  All other ROS reviewed and negative.     Physical Exam/Data:   Vitals:   04/04/17 2215 04/04/17 2300 04/05/17 0006 04/05/17 0544  BP:  (!) 166/87 (!) 152/83 125/68  Pulse: 93 (!) 103 (!) 101 98  Resp: (!) 29 (!) 23 18 18   Temp:   98.2 F (36.8 C) 98 F (36.7 C)  TempSrc:   Oral Oral  SpO2: 93% 90% 92% 92%  Weight:   143 lb 1.6 oz (64.9 kg)   Height:   5\' 1"  (1.549 m)     Intake/Output Summary (Last 24 hours) at 04/05/2017 1737 Last data filed at 04/05/2017 0819 Gross per 24 hour  Intake 290 ml  Output 1250 ml  Net -960 ml   Filed Weights   04/05/17 0006  Weight: 143 lb 1.6 oz (64.9 kg)   Body mass index is 27.04 kg/m.  General:  Well nourished, well developed, in no acute distress, she has lost wt recently HEENT: normal Lymph: no adenopathy Neck: no JVD Endocrine:  No thryomegaly Vascular: No carotid bruits; pedal pulses 2+ bilaterally  Cardiac:  normal S1, S2; RRR; Q6-7 / 6 systolic murmur radiating to the axilla  Lungs:  clear to auscultation bilaterally, no wheezing, rhonchi or rales  Abd: soft, nontender, no hepatomegaly  Ext: 2+ lower ext edema Musculoskeletal:  No deformities, BUE and BLE strength normal and equal Skin: warm and dry  Neuro:  Alert and oriented X 3 MAE follows commands, no focal abnormalities noted Psych:  Normal to flat affect    Relevant CV Studies: Echo  04/05/17 Study Conclusions  - Left ventricle: The cavity size was normal. Wall thickness was   normal. Systolic function was severely reduced. The estimated   ejection fraction was in the range of 20% to 25%. There is   akinesis of the anteroseptal, anterior, and anterolateral   myocardium. Doppler parameters are consistent with a reversible   restrictive pattern, indicative of  decreased left ventricular   diastolic compliance and/or increased left atrial pressure (grade   3 diastolic dysfunction). - Aortic valve: Trileaflet; moderately thickened, moderately   calcified leaflets. Valve mobility was restricted. - Mitral valve: There was mild regurgitation. - Left atrium: The atrium was mildly dilated. - Right ventricle: The cavity size was mildly dilated. Wall   thickness was normal. - Right atrium: The atrium was mildly dilated. - Tricuspid valve: There was severe regurgitation. - Pulmonary arteries: Systolic pressure was moderately increased.   PA peak pressure: 47 mm Hg (S).  Laboratory Data:  Chemistry Recent Labs  Lab 04/04/17 1410 04/05/17 0521  NA 140  142  K 3.1* 3.6  CL 103 101  CO2 25 26  GLUCOSE 136* 150*  BUN 10 7  CREATININE 0.64 0.68  CALCIUM 9.2 8.9  GFRNONAA >60 >60  GFRAA >60 >60  ANIONGAP 12 15    Recent Labs  Lab 04/04/17 1410  PROT 7.0  ALBUMIN 3.2*  AST 26  ALT 22  ALKPHOS 124  BILITOT 2.6*   Hematology Recent Labs  Lab 04/04/17 1410 04/05/17 0521  WBC 6.4 14.4*  RBC 4.98 4.71  HGB 15.4* 14.2  HCT 46.8* 44.1  MCV 94.0 93.6  MCH 30.9 30.1  MCHC 32.9 32.2  RDW 15.8* 15.7*  PLT 217 223   Cardiac EnzymesNo results for input(s): TROPONINI in the last 168 hours. No results for input(s): TROPIPOC in the last 168 hours.  BNP Recent Labs  Lab 04/04/17 1416  BNP 1,060.2*    DDimer No results for input(s): DDIMER in the last 168 hours.  Radiology/Studies:  See above   Assessment and Plan:   1. 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   Neg. 960 since admit --on Avapro 75,, toprol 25 and ASA, on lasix 40 bid.  Troponin not done.  Dr. Acie Fredrickson to see.  Medical therapy with no biopsy for cancer she has not decided if she will have treatment otherwise.  medical therapy may be best option.  If pt decides on therapy for breast cancer at later date we would be more  aggressive.  2. Tachycardia at times on tele.  EKG not in computer to review have ordered one.  3. New Breast cancer with metastasis possible to spine and rib.Marland Kitchen+ lymph nodes  Pt is now aware and is DNR.  She does not wish to have biopsy.  4. Acute chronic and diastolic HF.   On BB, and ARB, along with ASA and lasix.   For questions or updates, please contact [place] Please consult [email or web address] for contact info under Cardiology/STEMI.   Signed, Cecilie Kicks, NP  04/05/2017 5:37 PM    Attending Note:   The patient was seen and examined.  Agree with assessment and plan as noted above.  Changes made to the above note as needed.  Patient seen and independently examined with  Cecilie Kicks, NP .   We discussed all aspects of the encounter. I agree with the assessment and plan as stated above.  1.   Acute on chronic combined systolic and diastolic congestive heart failure: The patient presented to the hospital with leg edema for the past 2-3 months.  She has a history of hypertension but was not on any medications.  In the emergency room she had a CT scan which revealed widely metastatic breast cancer.  Echocardiogram performed today reveals markedly depressed left ventricular systolic function with ejection fraction of 25-30%.  She has grade 3 diastolic dysfunction.  She has mild mitral regurgitation.  Moderate pulmonary artery hypertension with an estimated PA pressure 47 mmHg.  She has been started on Lasix, Toprol-XL 25 mg a day, and Irbesartan 75 mg a day.  She seems to be feeling better. She is noted about her breast mass for quite some time but did not want it treated.  She does not have any intention on getting a breast biopsy or having chemotherapy.  From a cardiac standpoint, I think that conservative symptom based care is in order.  She seems to be feeling better with diuretics and ARB, beta-blocker. I do not think that  a ischemic workup is warranted.  I think that she needs  a palliative care consult to help her manage with her 2 very severe issues.      I have spent a total of 40 minutes with patient reviewing hospital  notes , telemetry, EKGs, labs and examining patient as well as establishing an assessment and plan that was discussed with the patient. > 50% of time was spent in direct patient care.    Thayer Headings, Brooke Bonito., MD, [facility] 04/05/2017, 5:55 PM [address]  [place] Pager [phone]

## 2017-04-05 NOTE — Progress Notes (Signed)
Patient arrived with son and daughter in law from [facility] ED, patient legs swollen and has open areas on BLE. RN washed and dressed BLE. Patient has a red, non blanchable area in between buttocks, there is also an open area on the area. RN consulted wound care. Patient oriented to unit, updated on plan of care, and safety precautions in place.

## 2017-04-05 NOTE — Progress Notes (Signed)
Page to Dr Posey Pronto to notify of pts request to be DNR

## 2017-04-05 NOTE — Consult Note (Addendum)
[facility] Nurse wound consult note Reason for Consult: Consult requested for buttocks/sacrum and bilat legs.  Pt states her legs began swelling and weeping at home. Wound type: Sacrum/buttocks with patchy areas of red moist partial thickness skin loss, surrounded by red macerated skin.  Appearance is consistent with moisture associated skin damage and shear.  Pt states she sleeps in a chair at home. Affected area is 8X10cm Left leg with edema and full thickness skin loss; 4X4X.1cm, red and moist, mod amt yellow drainage, no odor Right leg with edema and full thickness skin loss; 6X4X.1cm, red and moist, mod amt yellow drainage, no odor Dressing procedure/placement/frequency: Foam dressing to protect and promote healing to buttocks/sacrum.  Xeroform gauze to bilat legs to avoid adherence over wound and promote healing. Discussed plan of care with patient and family member at the bedside. Please re-consult if further assistance is needed.  Thank-you,  Julien Girt MSN, [place], [place], [place], [place]

## 2017-04-05 NOTE — Progress Notes (Signed)
  Echocardiogram 2D Echocardiogram has been performed.  Jennette Dubin 04/05/2017, 3:14 PM

## 2017-04-05 NOTE — Progress Notes (Signed)
[facility] Progress Note  Patient: Evelyn Riley PZW:[id number]   PCP: Orlena Sheldon, PA-C DOB: 1945/07/26   DOA: 04/04/2017   DOS: 04/05/2017   Date of Service: the patient was seen and examined on 04/05/2017  Subjective: Patient feels her leg swelling is better.  Breathing is better.  No chest pain no abdominal pain.  No nausea no vomiting. Later on had extensive discussion with the patient regarding goals of care.  Brief hospital course: Pt. with no known PMH; admitted on 04/04/2017, presented with complaint of leg swelling, was found to have acute on chronic . Currently further plan is continue diuresis.  Assessment and Plan: 1.  Acute CHF. Specificity is not clear but most likely diastolic, right-sided CHF. Echocardiogram ordered. EKG unremarkable for any ischemia. Troponin negative as well. Continue IV Lasix 40 mg twice daily. Added ARB as well as Toprol-XL. Monitor on telemetry. PT OT evaluation.  2.  Right breast lesion, with lesion in right first rib as well as mediastinal lymphadenopathy, T1 lucency. CT scan of the chest is positive for right breast mass with lymphadenopathy as well as involvement of T1 spine. This most likely is consistent with metastatic breast cancer. Discussed with patient as well as family, patient mentions that she does not want any chemotherapy or immunotherapy at any cost. Biopsy was recommended after discussion with oncology although patient does not want to go for biopsy. Patient still wants to discuss with oncology regarding prognosis. Monitor.  3.  Mild hypokalemia. Will replace daily potassium.  4.  Depression. Patient shows an exhibit signs of depression with weight loss as well as flat affect. Monitor, no suicidal ideation.   5.  Bilateral leg edema as well as elevated d-dimer. Lower externally Doppler as well as CT scan negative for PE and DVT. Monitor.  Diet: cardiac diet DVT Prophylaxis: subcutaneous Heparin  Advance goals  of care discussion: DNR DNI Had an extensive discussion with patient.  Patient alert and oriented and in her right mind wants to come DNR/DNI.  Understand the consequences. Also does not want to pursue biopsy and wants to get treated for CHF only. Recommended patient to discuss with family.  Family Communication: family was present at bedside, at the time of interview. The pt provided permission to discuss medical plan with the family. Opportunity was given to ask question and all questions were answered satisfactorily.   Disposition:  Discharge to home.  Consultants: cardiology, oncology, IR Procedures: Echocardiogram   Antibiotics: Anti-infectives (From admission, onward)   None       Objective: Physical Exam: Vitals:   04/04/17 2215 04/04/17 2300 04/05/17 0006 04/05/17 0544  BP:  (!) 166/87 (!) 152/83 125/68  Pulse: 93 (!) 103 (!) 101 98  Resp: (!) 29 (!) 23 18 18   Temp:   98.2 F (36.8 C) 98 F (36.7 C)  TempSrc:   Oral Oral  SpO2: 93% 90% 92% 92%  Weight:   64.9 kg (143 lb 1.6 oz)   Height:   5\' 1"  (1.549 m)     Intake/Output Summary (Last 24 hours) at 04/05/2017 1625 Last data filed at 04/05/2017 4235 Gross per 24 hour  Intake 290 ml  Output 1250 ml  Net -960 ml   Filed Weights   04/05/17 0006  Weight: 64.9 kg (143 lb 1.6 oz)   General: Alert, Awake and Oriented to Time, Place and Person. Appear in mild distress, affect appropriate Eyes: PERRL, Conjunctiva normal ENT: Oral Mucosa clear moist Neck: positive JVD, no Abnormal  Mass Or lumps Cardiovascular: S1 and S2 Present, no Murmur, Peripheral Pulses Present Respiratory: normal respiratory effort, Bilateral Air entry equal and Decreased, no use of accessory muscle, basal Crackles, no wheezes Abdomen: Bowel Sound present, Soft and no tenderness, no hernia Skin: no redness, no Rash, no induration Extremities: bilateral Pedal edema, no calf tenderness Neurologic: Grossly no focal neuro deficit. Bilaterally Equal  motor strength  Data Reviewed: CBC: Recent Labs  Lab 04/04/17 1410 04/05/17 0521  WBC 6.4 14.4*  NEUTROABS 4.8  --   HGB 15.4* 14.2  HCT 46.8* 44.1  MCV 94.0 93.6  PLT 217 782   Basic Metabolic Panel: Recent Labs  Lab 04/04/17 1410 04/05/17 0521  NA 140 142  K 3.1* 3.6  CL 103 101  CO2 25 26  GLUCOSE 136* 150*  BUN 10 7  CREATININE 0.64 0.68  CALCIUM 9.2 8.9  MG  --  2.1    Liver Function Tests: Recent Labs  Lab 04/04/17 1410  AST 26  ALT 22  ALKPHOS 124  BILITOT 2.6*  PROT 7.0  ALBUMIN 3.2*   No results for input(s): LIPASE, AMYLASE in the last 168 hours. No results for input(s): AMMONIA in the last 168 hours. Coagulation Profile: No results for input(s): INR, PROTIME in the last 168 hours. Cardiac Enzymes: No results for input(s): CKTOTAL, CKMB, CKMBINDEX, TROPONINI in the last 168 hours. BNP (last 3 results) No results for input(s): PROBNP in the last 8760 hours. CBG: No results for input(s): GLUCAP in the last 168 hours. Studies: Dg Chest 2 View  Result Date: 04/04/2017 CLINICAL DATA:  Lower extremity edema.  Hypertension. EXAM: CHEST  2 VIEW COMPARISON:  None. FINDINGS: There is consolidation throughout portions of the right middle and lower lobe with small right pleural effusion. The left lung is clear. There is cardiomegaly with pulmonary venous hypertension. There is soft tissue prominence in the right hilum concerning for adenopathy. There is aortic atherosclerosis. There is an old fracture of the right posterolateral seventh rib. No blastic or lytic bone lesions are evident. IMPRESSION: 1. Consolidation throughout much of the right middle lobe as well as in portions of the right lower lobe with right pleural effusion. Suspect pneumonia. 2.  Pulmonary vascular congestion. 3. Prominence of the right hilar region. Question a degree of adenopathy. This finding may well warrant contrast enhanced chest CT to further evaluate. 4.  Aortic atherosclerosis.  Aortic Atherosclerosis (ICD10-I70.0). Electronically Signed   By: Lowella Grip III M.D.   On: 04/04/2017 19:59   Ct Angio Chest Pe W And/or Wo Contrast  Addendum Date: 04/04/2017   ADDENDUM REPORT: 04/04/2017 23:14 ADDENDUM: Lucent and sclerotic appearance of the right first rib, may reflect additional osseous metastatic focus. Electronically Signed   By: Donavan Foil M.D.   On: 04/04/2017 23:14   Result Date: 04/04/2017 CLINICAL DATA:  Shortness of breath for 1 day EXAM: CT ANGIOGRAPHY CHEST WITH CONTRAST TECHNIQUE: Multidetector CT imaging of the chest was performed using the standard protocol during bolus administration of intravenous contrast. Multiplanar CT image reconstructions and MIPs were obtained to evaluate the vascular anatomy. CONTRAST:  110 mL Isovue 370 intravenous COMPARISON:  Radiograph 04/04/2017 FINDINGS: Cardiovascular: Satisfactory opacification of the pulmonary arteries to the segmental level. No evidence of pulmonary embolism. Nonaneurysmal aorta. Moderate aortic atherosclerosis. Mild coronary artery calcification. Cardiomegaly with trace pericardial effusion. Mediastinum/Nodes: Midline trachea. No thyroid mass. Enlarged mediastinal lymph nodes. Right pretracheal lymph node measures 12 mm. Right precarinal lymph node measures 16 mm. Esophagus within  normal limits. Multiple enlarged right axillary and subpectoral lymph nodes, measuring up to 3.5 cm. Lungs/Pleura: Moderate right-sided pleural effusion. Mild atelectasis in the right middle lobe and right lung base. No pneumothorax. Upper Abdomen: Partially visualized hypodensity in the posterior right hepatic lobe. Adrenal glands are within normal limits. Musculoskeletal: Large sub areolar mass in the right breast with suggestion of nipple retraction.Mass measures approximately 5.6 x 2.9 by 7.2 cm. Bones demonstrate lucent lesion in the T1 vertebral body, mixed lucency and sclerosis at T2, T8, T11, and T12. Review of the MIP images  confirms the above findings. IMPRESSION: 1. Negative for acute pulmonary embolus. Cardiomegaly with moderate right-sided pleural effusion. 2. Large right subareolar breast mass concerning for breast carcinoma. Overlying nipple retraction. Multiple enlarged right axillary and subpectoral lymph nodes concerning for metastatic disease. 3. Enlarged mediastinal lymph nodes, suspect for metastatic disease 4. Multiple lucent and mixed lucent and sclerotic lesions within the spine as above concerning for osseous metastatic disease. Critical Value/emergent results were called by telephone at the time of interpretation on 04/04/2017 at 10:50 pm to Dr. Carlisle Cater , who verbally acknowledged these results. Aortic Atherosclerosis (ICD10-I70.0). Electronically Signed: By: Donavan Foil M.D. On: 04/04/2017 22:50    Scheduled Meds: . aspirin EC  81 mg Oral Daily  . enoxaparin (LOVENOX) injection  40 mg Subcutaneous Q24H  . furosemide  40 mg Intravenous BID  . irbesartan  75 mg Oral Daily  . metoprolol succinate  25 mg Oral Daily  . potassium chloride  20 mEq Oral BID  . sodium chloride flush  3 mL Intravenous Q12H   Continuous Infusions: . sodium chloride     PRN Meds: sodium chloride, acetaminophen, ondansetron (ZOFRAN) IV, sodium chloride flush  Time spent: 35 minutes  Author: Berle Mull, MD [facility] Pager: [phone] 04/05/2017 4:25 PM  If 7PM-7AM, please contact night-coverage at [email or web address], password [facility]

## 2017-04-06 DIAGNOSIS — Z7189 Other specified counseling: Secondary | ICD-10-CM

## 2017-04-06 DIAGNOSIS — I5043 Acute on chronic combined systolic (congestive) and diastolic (congestive) heart failure: Secondary | ICD-10-CM

## 2017-04-06 LAB — CBC
HCT: 43.3 % (ref 36.0–46.0)
Hemoglobin: 13.9 g/dL (ref 12.0–15.0)
MCH: 30.2 pg (ref 26.0–34.0)
MCHC: 32.1 g/dL (ref 30.0–36.0)
MCV: 93.9 fL (ref 78.0–100.0)
Platelets: 214 10*3/uL (ref 150–400)
RBC: 4.61 MIL/uL (ref 3.87–5.11)
RDW: 16.1 % — ABNORMAL HIGH (ref 11.5–15.5)
WBC: 9.2 10*3/uL (ref 4.0–10.5)

## 2017-04-06 LAB — BASIC METABOLIC PANEL
Anion gap: 12 (ref 5–15)
BUN: 10 mg/dL (ref 6–20)
CALCIUM: 8.7 mg/dL — AB (ref 8.9–10.3)
CO2: 27 mmol/L (ref 22–32)
Chloride: 100 mmol/L — ABNORMAL LOW (ref 101–111)
Creatinine, Ser: 0.77 mg/dL (ref 0.44–1.00)
GFR calc Af Amer: >60 mL/min (ref >60)
GLUCOSE: 141 mg/dL — AB (ref 65–99)
Potassium: 3.5 mmol/L (ref 3.5–5.1)
Sodium: 139 mmol/L (ref 135–145)

## 2017-04-06 MED ORDER — FUROSEMIDE 10 MG/ML IJ SOLN
40.0000 mg | Freq: Once | INTRAMUSCULAR | Status: AC
  Administered 2017-04-06: 40 mg via INTRAVENOUS
  Filled 2017-04-06: qty 4

## 2017-04-06 MED ORDER — FUROSEMIDE 10 MG/ML IJ SOLN
60.0000 mg | Freq: Two times a day (BID) | INTRAMUSCULAR | Status: DC
  Administered 2017-04-06 – 2017-04-07 (×2): 60 mg via INTRAVENOUS
  Filled 2017-04-06 (×2): qty 6

## 2017-04-06 MED ORDER — POTASSIUM CHLORIDE CRYS ER 20 MEQ PO TBCR
40.0000 meq | EXTENDED_RELEASE_TABLET | Freq: Two times a day (BID) | ORAL | Status: DC
  Administered 2017-04-06 – 2017-04-07 (×3): 40 meq via ORAL
  Filled 2017-04-06 (×3): qty 2

## 2017-04-06 NOTE — Consult Note (Signed)
Consultation Note Date: 04/06/2017   Patient Name: Evelyn Riley  DOB: 14-Feb-1946  MRN: [id number]  Age / Sex: 72 y.o., female  PCP: Rennis Golden Referring Physician: Lavina Hamman, MD  Reason for Consultation: Establishing goals of care, hospice eval  HPI/Patient Profile: 72 y.o. female  with past medical history of HTN, on no medications and receiving no medical care, no PCP admitted on 04/04/2017 with progressive BLE edema and SOB. Found to have newly diagnosed combined systolic and diastolic heart failure as well as new right breast lesion with lesion to right first rib, mediastinal lymphadenopathy, TI lesion consistent with advanced metastatic breast cancer.   Clinical Assessment and Goals of Care: I met today with Ms. Kawa along with her son and daughter-in-law at bedside. They are overwhelmed with the diagnoses she has received since admission. Ms. Mikel has been very independent and has always avoided doctors and medicines. She lives alone in [place] but only about 1 mile from her son present. She has 2 other sons - 1 in [place] and 1 in [place], [place] (she has not discussed her diagnoses with other 2 yet).   They have concerns about setting up medical care for her when she leaves the hospital as she has no established care with PCP or any specialist. She should hopefully receive f/u cardiology appt at time of discharge. She is interested in speaking with oncology to find out more information about potential options, prognosis, etc but at this time is not interested in work up or treatment. She tells me that she is focused on getting her CHF controlled and then see how she feels and what she wants to do (or not do) about her cancer. She tells me that she is almost decided now that she is not interested in biopsy or any treatment and just living out her life the best she can naturally. However, we all  agree that she does not have to make this decision right now. She is more concerned about "what people will think about my decision." We discussed (and her son reassured her) that the people that are closest and love her will understand and support her decision but that everyone always has an opinion. If she is at peace and speaks with her family we feel her children will understand. Her son explains that he was upset at first but after getting all the information and speaking with the doctors he understands that a more comfort approach is likely best for her (especially as she does not have many symptoms except decline in appetite and everything else likely r/t CHF).   They are interested in cardiology f/u and meeting with oncology for information purpose. They are interested in outpatient palliative follow up - this may be best met with Wadie Lessen, NP in the [facility] available Monday mornings. I also spoke privately with her son and we did discuss that if she decides against treatment and work up then hospice may be a good support and resource to help  care for her at home. He understands. He is worried that she is having more symptoms with SOB especially that she keeps denying. I will f/u again tomorrow and continue discussion.   Primary Decision Maker PATIENT    SUMMARY OF RECOMMENDATIONS   - Outpt palliative vs hospice at home (may need to start with palliative care to give her some time to discuss with her family and process diagnosis)  Code Status/Advance Care Planning:  DNR   Symptom Management:   Cardiology managing diuresis - BLE still 2-3+ and some weeping. Denies SOB. Ambulates in room with relative ease and independently.   Palliative Prophylaxis:   Bowel Regimen and Delirium Protocol  Additional Recommendations (Limitations, Scope, Preferences):  No Chemotherapy and No Surgical Procedures  Psycho-social/Spiritual:   Desire for further Chaplaincy  support:no  Additional Recommendations: Caregiving  Support/Resources and Education on Hospice  Prognosis:   If she continues to decide to forego treatment for cancer then eligible for home hospice and likely < 6 months with gradual decline and weight loss over past ~1 year.   Discharge Planning: To Be Determined. Likely home with home health and palliative care vs hospice (have not discussed hospice with patient yet).       Primary Diagnoses: Present on Admission: **None**   I have reviewed the medical record, interviewed the patient and family, and examined the patient. The following aspects are pertinent.  Past Medical History:  Diagnosis Date  . Hypertension    Social History   Socioeconomic History  . Marital status: Single    Spouse name: None  . Number of children: None  . Years of education: None  . Highest education level: None  Social Needs  . Financial resource strain: None  . Food insecurity - worry: None  . Food insecurity - inability: None  . Transportation needs - medical: None  . Transportation needs - non-medical: None  Occupational History  . None  Tobacco Use  . Smoking status: Never Smoker  . Smokeless tobacco: Never Used  Substance and Sexual Activity  . Alcohol use: No  . Drug use: No  . Sexual activity: None  Other Topics Concern  . None  Social History Narrative  . None   Family History  Problem Relation Age of Onset  . Hypertension Mother   . Heart disease Father 23       died at 65   Scheduled Meds: . aspirin EC  81 mg Oral Daily  . enoxaparin (LOVENOX) injection  40 mg Subcutaneous Q24H  . furosemide  60 mg Intravenous BID  . irbesartan  75 mg Oral Daily  . metoprolol succinate  25 mg Oral Daily  . potassium chloride  40 mEq Oral BID  . sodium chloride flush  3 mL Intravenous Q12H   Continuous Infusions: . sodium chloride     PRN Meds:.sodium chloride, acetaminophen, ondansetron (ZOFRAN) IV, sodium chloride flush No  Known Allergies Review of Systems  Constitutional: Positive for activity change, appetite change, fatigue and unexpected weight change.  Respiratory: Negative for shortness of breath.   Gastrointestinal: Negative for constipation and nausea.  Neurological: Positive for weakness.  Psychiatric/Behavioral: Positive for sleep disturbance.       Sleep disturbance is chronic for many years - she does not desire sleep aide.     Physical Exam  Constitutional: She is oriented to person, place, and time. She appears well-developed.  HENT:  Head: Normocephalic and atraumatic.  Cardiovascular: Normal rate and regular rhythm.  Pulmonary/Chest: Effort  normal. No accessory muscle usage. No tachypnea. No respiratory distress.  Abdominal: Soft. Normal appearance.  Neurological: She is alert and oriented to person, place, and time.  Psychiatric:  Appropriately tearful at times  Nursing note and vitals reviewed.   Vital Signs: BP 131/82 (BP Location: Left Arm)   Pulse 72   Temp 97.9 F (36.6 C) (Oral)   Resp 18   Ht '5\' 1"'  (1.549 m)   Wt 64.7 kg (142 lb 11.2 oz)   SpO2 96%   BMI 26.96 kg/m  Pain Assessment: No/denies pain   Pain Score: 0-No pain   SpO2: SpO2: 96 % O2 Device:SpO2: 96 % O2 Flow Rate: .O2 Flow Rate (L/min): 2 L/min  IO: Intake/output summary:   Intake/Output Summary (Last 24 hours) at 04/06/2017 1911 Last data filed at 04/06/2017 1904 Gross per 24 hour  Intake 780 ml  Output 2700 ml  Net -1920 ml    LBM: Last BM Date: 04/05/17 Baseline Weight: Weight: 64.9 kg (143 lb 1.6 oz)(scale b) Most recent weight: Weight: 64.7 kg (142 lb 11.2 oz)     Palliative Assessment/Data: 60%     Time Total: 70 min  Greater than 50%  of this time was spent counseling and coordinating care related to the above assessment and plan.  Signed by: Vinie Sill, NP Palliative Medicine Team Pager # [phone] (M-F 8a-5p) Team Phone # [phone] (Nights/Weekends)

## 2017-04-06 NOTE — Evaluation (Signed)
Occupational Therapy Evaluation and Discharge Patient Details Name: Evelyn Riley MRN: [id number] DOB: 12-25-1945 Today's Date: 04/06/2017    History of Present Illness Pt is a 72 y.o. female admitted 04/04/17 with BLE swelling; worked up for CHF exacerbation. CT and LE doppler negative for PE and DVT. Chest CT showed R breast mass with T1 spine involvement, consistent with metastatic breast cancer; pt declining cehmo and immunotherapy. PMH includes HTN.   Clinical Impression   Pt independent in ADL and mobility this session. Pt provided with handout on energy conservation consistent with CHF and talked about in the future if she starts to miss out on meaningful occupations to let her PCP or MD know and to involve OT at that time. All education complete. No questions or concerns for OT at the end of the session,and as Pt is independent OT to sign off at this time. Thank you for the opportunity to serve this patient.     Follow Up Recommendations  No OT follow up    Equipment Recommendations  None recommended by OT(Pt states she has access to DME if needed)    Recommendations for Other Services       Precautions / Restrictions Precautions Precautions: None Restrictions Weight Bearing Restrictions: No      Mobility Bed Mobility Overal bed mobility: Independent                Transfers Overall transfer level: Independent Equipment used: None                  Balance Overall balance assessment: No apparent balance deficits (not formally assessed)                                         ADL either performed or assessed with clinical judgement   ADL Overall ADL's : Independent                                             Vision Patient Visual Report: No change from baseline       Perception     Praxis      Pertinent Vitals/Pain Pain Assessment: No/denies pain     Hand Dominance     Extremity/Trunk Assessment Upper  Extremity Assessment Upper Extremity Assessment: Overall WFL for tasks assessed   Lower Extremity Assessment Lower Extremity Assessment: Overall WFL for tasks assessed       Communication Communication Communication: No difficulties   Cognition Arousal/Alertness: Awake/alert Behavior During Therapy: WFL for tasks assessed/performed Overall Cognitive Status: Within Functional Limits for tasks assessed                                     General Comments  Session focused on energy conservation, making sure that she was aware of quality of life and meaningful occupations, and that in the future she can get orders for OT from her PCP    Exercises     Shoulder Instructions      Home Living Family/patient expects to be discharged to:: Private residence Living Arrangements: Alone Available Help at Discharge: Family;Available PRN/intermittently Type of Home: House Home Access: Stairs to enter [employer] of Steps: 4 Entrance Stairs-Rails: Right Home Layout:  One level     Bathroom Shower/Tub: Occupational psychologist: Standard         Additional Comments: Son's family lives nearby; has access to DME if needed      Prior Functioning/Environment Level of Independence: Independent        Comments: Drives short distances; son assists with longer trips/errands if needed. Indep with amb and ADLs        OT Problem List:        OT Treatment/Interventions:      OT Goals(Current goals can be found in the care plan section) Acute Rehab OT Goals Patient Stated Goal: to get home  OT Frequency:     Barriers to D/C:            Co-evaluation              AM-PAC PT "6 Clicks" Daily Activity     Outcome Measure Help from another person eating meals?: None Help from another person taking care of personal grooming?: None Help from another person toileting, which includes using toliet, bedpan, or urinal?: None Help from another  person bathing (including washing, rinsing, drying)?: None Help from another person to put on and taking off regular upper body clothing?: None Help from another person to put on and taking off regular lower body clothing?: None 6 Click Score: 24   End of Session Nurse Communication: Mobility status(in room)  Activity Tolerance: Patient tolerated treatment well Patient left: in bed;with call bell/phone within reach;with nursing/sitter in room                   Time: 7741-2878 OT Time Calculation (min): 14 min Charges:  OT General Charges $OT Visit: 1 Visit OT Evaluation $OT Eval Low Complexity: 1 Low G-Codes:     Evelyn Riley OTR/L [place] 04/06/2017, 9:26 AM

## 2017-04-06 NOTE — Plan of Care (Signed)
  Education: Knowledge of General Education information will improve 04/06/2017 2326 - Progressing by Tristan Schroeder, RN   Health Behavior/Discharge Planning: Ability to manage health-related needs will improve 04/06/2017 2326 - Progressing by Tristan Schroeder, RN   Clinical Measurements: Cardiovascular complication will be avoided 04/06/2017 2326 - Progressing by Tristan Schroeder, RN

## 2017-04-06 NOTE — Progress Notes (Signed)
Progress Note  Patient Name: Evelyn Riley Date of Encounter: 04/06/2017  Primary Cardiologist: new, Dinora Hemm   Subjective   72 year old female with a new diagnosis of acute on chronic combined systolic and diastolic congestive heart failure.  She also has a new diagnosis of widely metastatic breast cancer.  Is feeling to better today after diuresis.  She does not have any interest in treating the breast cancer but does want to treat the congestive heart failure.  Inpatient Medications    Scheduled Meds: . aspirin EC  81 mg Oral Daily  . enoxaparin (LOVENOX) injection  40 mg Subcutaneous Q24H  . furosemide  60 mg Intravenous BID  . irbesartan  75 mg Oral Daily  . metoprolol succinate  25 mg Oral Daily  . potassium chloride  40 mEq Oral BID  . sodium chloride flush  3 mL Intravenous Q12H   Continuous Infusions: . sodium chloride     PRN Meds: sodium chloride, acetaminophen, ondansetron (ZOFRAN) IV, sodium chloride flush   Vital Signs    Vitals:   04/06/17 0051 04/06/17 0252 04/06/17 0714 04/06/17 1152  BP: 119/70 138/67 134/78 131/82  Pulse: 86 81 77 72  Resp: 18 18 20 18   Temp: 98.1 F (36.7 C) 98.4 F (36.9 C) 97.9 F (36.6 C) 97.9 F (36.6 C)  TempSrc: Oral Oral Oral Oral  SpO2: 94% 94% 92% 96%  Weight:   142 lb 11.2 oz (64.7 kg)   Height:        Intake/Output Summary (Last 24 hours) at 04/06/2017 1754 Last data filed at 04/06/2017 1725 Gross per 24 hour  Intake 540 ml  Output 2700 ml  Net -2160 ml   Filed Weights   04/05/17 0006 04/06/17 0714  Weight: 143 lb 1.6 oz (64.9 kg) 142 lb 11.2 oz (64.7 kg)    Telemetry    Normal sinus rhythm- Personally Reviewed  ECG    Normal sinus rhythm- Personally Reviewed  Physical Exam  Lysed female, no acute distress GEN: No acute distress.   Neck: No JVD Cardiac: RRR, 1/6 to 2/6 systolic ejection murmur Respiratory: Clear to auscultation bilaterally. GI: Soft, nontender, non-distended  MS:  1-2+ pitting  edema Neuro:  Nonfocal  Psych: Normal affect   Labs    Chemistry Recent Labs  Lab 04/04/17 1410 04/05/17 0521 04/06/17 0527  NA 140 142 139  K 3.1* 3.6 3.5  CL 103 101 100*  CO2 25 26 27   GLUCOSE 136* 150* 141*  BUN 10 7 10   CREATININE 0.64 0.68 0.77  CALCIUM 9.2 8.9 8.7*  PROT 7.0  --   --   ALBUMIN 3.2*  --   --   AST 26  --   --   ALT 22  --   --   ALKPHOS 124  --   --   BILITOT 2.6*  --   --   GFRNONAA >60 >60 >60  GFRAA >60 >60 >60  ANIONGAP 12 15 12      Hematology Recent Labs  Lab 04/04/17 1410 04/05/17 0521 04/06/17 0527  WBC 6.4 14.4* 9.2  RBC 4.98 4.71 4.61  HGB 15.4* 14.2 13.9  HCT 46.8* 44.1 43.3  MCV 94.0 93.6 93.9  MCH 30.9 30.1 30.2  MCHC 32.9 32.2 32.1  RDW 15.8* 15.7* 16.1*  PLT 217 223 214    Cardiac EnzymesNo results for input(s): TROPONINI in the last 168 hours. No results for input(s): TROPIPOC in the last 168 hours.   BNP Recent Labs  Lab  04/04/17 1416  BNP 1,060.2*     DDimer No results for input(s): DDIMER in the last 168 hours.   Radiology    Dg Chest 2 View  Result Date: 04/04/2017 CLINICAL DATA:  Lower extremity edema.  Hypertension. EXAM: CHEST  2 VIEW COMPARISON:  None. FINDINGS: There is consolidation throughout portions of the right middle and lower lobe with small right pleural effusion. The left lung is clear. There is cardiomegaly with pulmonary venous hypertension. There is soft tissue prominence in the right hilum concerning for adenopathy. There is aortic atherosclerosis. There is an old fracture of the right posterolateral seventh rib. No blastic or lytic bone lesions are evident. IMPRESSION: 1. Consolidation throughout much of the right middle lobe as well as in portions of the right lower lobe with right pleural effusion. Suspect pneumonia. 2.  Pulmonary vascular congestion. 3. Prominence of the right hilar region. Question a degree of adenopathy. This finding may well warrant contrast enhanced chest CT to further  evaluate. 4.  Aortic atherosclerosis. Aortic Atherosclerosis (ICD10-I70.0). Electronically Signed   By: Lowella Grip III M.D.   On: 04/04/2017 19:59   Ct Angio Chest Pe W And/or Wo Contrast  Addendum Date: 04/04/2017   ADDENDUM REPORT: 04/04/2017 23:14 ADDENDUM: Lucent and sclerotic appearance of the right first rib, may reflect additional osseous metastatic focus. Electronically Signed   By: Donavan Foil M.D.   On: 04/04/2017 23:14   Result Date: 04/04/2017 CLINICAL DATA:  Shortness of breath for 1 day EXAM: CT ANGIOGRAPHY CHEST WITH CONTRAST TECHNIQUE: Multidetector CT imaging of the chest was performed using the standard protocol during bolus administration of intravenous contrast. Multiplanar CT image reconstructions and MIPs were obtained to evaluate the vascular anatomy. CONTRAST:  110 mL Isovue 370 intravenous COMPARISON:  Radiograph 04/04/2017 FINDINGS: Cardiovascular: Satisfactory opacification of the pulmonary arteries to the segmental level. No evidence of pulmonary embolism. Nonaneurysmal aorta. Moderate aortic atherosclerosis. Mild coronary artery calcification. Cardiomegaly with trace pericardial effusion. Mediastinum/Nodes: Midline trachea. No thyroid mass. Enlarged mediastinal lymph nodes. Right pretracheal lymph node measures 12 mm. Right precarinal lymph node measures 16 mm. Esophagus within normal limits. Multiple enlarged right axillary and subpectoral lymph nodes, measuring up to 3.5 cm. Lungs/Pleura: Moderate right-sided pleural effusion. Mild atelectasis in the right middle lobe and right lung base. No pneumothorax. Upper Abdomen: Partially visualized hypodensity in the posterior right hepatic lobe. Adrenal glands are within normal limits. Musculoskeletal: Large sub areolar mass in the right breast with suggestion of nipple retraction.Mass measures approximately 5.6 x 2.9 by 7.2 cm. Bones demonstrate lucent lesion in the T1 vertebral body, mixed lucency and sclerosis at T2, T8,  T11, and T12. Review of the MIP images confirms the above findings. IMPRESSION: 1. Negative for acute pulmonary embolus. Cardiomegaly with moderate right-sided pleural effusion. 2. Large right subareolar breast mass concerning for breast carcinoma. Overlying nipple retraction. Multiple enlarged right axillary and subpectoral lymph nodes concerning for metastatic disease. 3. Enlarged mediastinal lymph nodes, suspect for metastatic disease 4. Multiple lucent and mixed lucent and sclerotic lesions within the spine as above concerning for osseous metastatic disease. Critical Value/emergent results were called by telephone at the time of interpretation on 04/04/2017 at 10:50 pm to Dr. Carlisle Cater , who verbally acknowledged these results. Aortic Atherosclerosis (ICD10-I70.0). Electronically Signed: By: Donavan Foil M.D. On: 04/04/2017 22:50    Cardiac Studies     Patient Profile     72 y.o. female with recent diagnosis of acute on chronic combined systolic and diastolic congestive  heart failure and a new diagnosis of metastatic right breast cancer.  Assessment & Plan    1.  Acute on chronic systolic and diastolic congestive heart failure. The patient has diuresed 3.5 L.  She is feeling quite a bit better.  She is breathing better and her legs are less swollen.  We discussed the fact that she does not want to treat her breast cancer. Given this fact, I think we should have a rather conservative approach to her congestive heart failure.  We discussed the possibility of cardiac catheterization with the possibility of stenting.  At present she has not had any episodes of chest discomfort.  She has no acute ST or T wave changes and there is no evidence that this is an acute coronary syndrome.    I told her that I would think that we would not necessarily want to pursue invasive procedures since there is some inherent risk with those procedures and there is no guarantee that they would actually benefit her.     Continue with current medications.  We will continue to have further discussion tomorrow.  She would like to talk to the oncologist about prognosis.  She does not want to have a breast biopsy although she may not fully understand that a breast biopsy could be done minimally invasively.      For questions or updates, please contact [facility] Please consult [email or web address] for contact info under Cardiology/STEMI.      Signed, Mertie Moores, MD  04/06/2017, 5:54 PM

## 2017-04-06 NOTE — Progress Notes (Addendum)
[facility] Progress Note  Patient: Evelyn Riley KDT:[id number]   PCP: Orlena Sheldon, PA-C DOB: June 13, 1945   DOA: 04/04/2017   DOS: 04/06/2017   Date of Service: the patient was seen and examined on 04/06/2017  Subjective: feeling better, no nausea or vomiting, no chest pain no shortnes of breath.  Patient told me that she knew about the lesion for a while, but does not want to get it treated.  Brief hospital course: Pt. with no known PMH; admitted on 04/04/2017, presented with complaint of leg swelling, was found to have acute on chronic . Currently further plan is continue diuresis.  Assessment and Plan: 1.  Acute CHF. Specificity is not clear but most likely diastolic, right-sided CHF. Echocardiogram ordered. EKG unremarkable for any ischemia. Troponin negative as well. Continue IV Lasix increased to 60 mg twice daily.  Will receive a total of 140 mg IV Lasix today. Added ARB as well as Toprol-XL. Monitor on telemetry. PT OT evaluation.  2.  Right breast lesion, with lesion in right first rib as well as mediastinal lymphadenopathy, T1 lucency. CT scan of the chest is positive for right breast mass with lymphadenopathy as well as involvement of T1 spine. This most likely is consistent with metastatic breast cancer. Discussed with patient as well as family, patient mentions that she does not want any chemotherapy or immunotherapy at any cost. Biopsy was recommended after discussion with oncology although patient does not want to go for biopsy. Patient still wants to discuss with oncology regarding prognosis. Monitor.  3.  Mild hypokalemia. Will replace daily potassium.  4.    Mood disorder. Shows no severe signs of depression.  Mild anxiety given current diagnosis. Monitor, no suicidal ideation.   5.  Bilateral leg edema as well as elevated d-dimer. Lower externally Doppler as well as CT scan negative for PE and DVT. Monitor.  Diet: cardiac diet DVT Prophylaxis:  subcutaneous Heparin  Advance goals of care discussion: DNR DNI Had an extensive discussion with patient.  Patient alert and oriented and in her right mind wants to come DNR/DNI.  Understand the consequences. Also does not want to pursue biopsy and wants to get treated for CHF only. Recommended patient to discuss with family.  Palliative care consulted for goals of care discussion going forward, possible home hospice?  Family Communication: no family was present at bedside, at the time of interview.  Discussed case with patient's son Lennette Bihari on 04/05/2017  Disposition:  Discharge to home.  Consultants: cardiology, oncology, IR Procedures: Echocardiogram   Antibiotics: Anti-infectives (From admission, onward)   None       Objective: Physical Exam: Vitals:   04/06/17 0051 04/06/17 0252 04/06/17 0714 04/06/17 1152  BP: 119/70 138/67 134/78 131/82  Pulse: 86 81 77 72  Resp: 18 18 20 18   Temp: 98.1 F (36.7 C) 98.4 F (36.9 C) 97.9 F (36.6 C) 97.9 F (36.6 C)  TempSrc: Oral Oral Oral Oral  SpO2: 94% 94% 92% 96%  Weight:   64.7 kg (142 lb 11.2 oz)   Height:        Intake/Output Summary (Last 24 hours) at 04/06/2017 1658 Last data filed at 04/06/2017 1327 Gross per 24 hour  Intake 780 ml  Output 2400 ml  Net -1620 ml   Filed Weights   04/05/17 0006 04/06/17 0714  Weight: 64.9 kg (143 lb 1.6 oz) 64.7 kg (142 lb 11.2 oz)   General: Alert, Awake and Oriented to Time, Place and Person. Appear in mild  distress, affect appropriate Eyes: PERRL, Conjunctiva normal ENT: Oral Mucosa clear moist Neck: positive JVD, no Abnormal Mass Or lumps Cardiovascular: S1 and S2 Present, no Murmur, Peripheral Pulses Present Respiratory: normal respiratory effort, Bilateral Air entry equal and Decreased, no use of accessory muscle, basal Crackles, no wheezes Abdomen: Bowel Sound present, Soft and no tenderness, no hernia Skin: no redness, no Rash, no induration Extremities: bilateral Pedal  edema, no calf tenderness Neurologic: Grossly no focal neuro deficit. Bilaterally Equal motor strength  Data Reviewed: CBC: Recent Labs  Lab 04/04/17 1410 04/05/17 0521 04/06/17 0527  WBC 6.4 14.4* 9.2  NEUTROABS 4.8  --   --   HGB 15.4* 14.2 13.9  HCT 46.8* 44.1 43.3  MCV 94.0 93.6 93.9  PLT 217 223 242   Basic Metabolic Panel: Recent Labs  Lab 04/04/17 1410 04/05/17 0521 04/06/17 0527  NA 140 142 139  K 3.1* 3.6 3.5  CL 103 101 100*  CO2 25 26 27   GLUCOSE 136* 150* 141*  BUN 10 7 10   CREATININE 0.64 0.68 0.77  CALCIUM 9.2 8.9 8.7*  MG  --  2.1  --     Liver Function Tests: Recent Labs  Lab 04/04/17 1410  AST 26  ALT 22  ALKPHOS 124  BILITOT 2.6*  PROT 7.0  ALBUMIN 3.2*   No results for input(s): LIPASE, AMYLASE in the last 168 hours. No results for input(s): AMMONIA in the last 168 hours. Coagulation Profile: No results for input(s): INR, PROTIME in the last 168 hours. Cardiac Enzymes: No results for input(s): CKTOTAL, CKMB, CKMBINDEX, TROPONINI in the last 168 hours. BNP (last 3 results) No results for input(s): PROBNP in the last 8760 hours. CBG: No results for input(s): GLUCAP in the last 168 hours. Studies: No results found.  Scheduled Meds: . aspirin EC  81 mg Oral Daily  . enoxaparin (LOVENOX) injection  40 mg Subcutaneous Q24H  . furosemide  60 mg Intravenous BID  . irbesartan  75 mg Oral Daily  . metoprolol succinate  25 mg Oral Daily  . potassium chloride  40 mEq Oral BID  . sodium chloride flush  3 mL Intravenous Q12H   Continuous Infusions: . sodium chloride     PRN Meds: sodium chloride, acetaminophen, ondansetron (ZOFRAN) IV, sodium chloride flush  Time spent: 35 minutes  Author: Berle Mull, MD [facility] Pager: [phone] 04/06/2017 4:58 PM  If 7PM-7AM, please contact night-coverage at [email or web address], password [facility]

## 2017-04-06 NOTE — Plan of Care (Signed)
  Education: Knowledge of General Education information will improve 04/06/2017 0010 - Progressing by Tristan Schroeder, RN   Health Behavior/Discharge Planning: Ability to manage health-related needs will improve 04/06/2017 0010 - Progressing by Tristan Schroeder, RN   Pain Managment: General experience of comfort will improve 04/06/2017 0010 - Progressing by Tristan Schroeder, RN

## 2017-04-07 DIAGNOSIS — Z515 Encounter for palliative care: Secondary | ICD-10-CM

## 2017-04-07 DIAGNOSIS — N63 Unspecified lump in unspecified breast: Secondary | ICD-10-CM

## 2017-04-07 DIAGNOSIS — Z7189 Other specified counseling: Secondary | ICD-10-CM

## 2017-04-07 DIAGNOSIS — N6310 Unspecified lump in the right breast, unspecified quadrant: Secondary | ICD-10-CM

## 2017-04-07 LAB — MAGNESIUM: MAGNESIUM: 1.4 mg/dL — AB (ref 1.7–2.4)

## 2017-04-07 LAB — BASIC METABOLIC PANEL
Anion gap: 14 (ref 5–15)
BUN: 9 mg/dL (ref 6–20)
CHLORIDE: 96 mmol/L — AB (ref 101–111)
CO2: 28 mmol/L (ref 22–32)
Calcium: 8.8 mg/dL — ABNORMAL LOW (ref 8.9–10.3)
Creatinine, Ser: 0.79 mg/dL (ref 0.44–1.00)
GFR calc Af Amer: >60 mL/min (ref >60)
GFR calc non Af Amer: >60 mL/min (ref >60)
Glucose, Bld: 105 mg/dL — ABNORMAL HIGH (ref 65–99)
POTASSIUM: 3.6 mmol/L (ref 3.5–5.1)
SODIUM: 138 mmol/L (ref 135–145)

## 2017-04-07 MED ORDER — FUROSEMIDE 40 MG PO TABS: 40.0000 mg | ORAL_TABLET | Freq: Every day | ORAL | Status: DC

## 2017-04-07 MED ORDER — METOPROLOL SUCCINATE ER 25 MG PO TB24: 25.0000 mg | ORAL_TABLET | Freq: Every day | ORAL | 0 refills | Status: DC

## 2017-04-07 MED ORDER — SPIRONOLACTONE 25 MG PO TABS: 12.5000 mg | ORAL_TABLET | Freq: Every day | ORAL | 0 refills | Status: DC

## 2017-04-07 MED ORDER — POTASSIUM CHLORIDE CRYS ER 10 MEQ PO TBCR: 10.0000 meq | EXTENDED_RELEASE_TABLET | Freq: Two times a day (BID) | ORAL | 0 refills | Status: DC

## 2017-04-07 MED ORDER — FUROSEMIDE 40 MG PO TABS: 40.0000 mg | ORAL_TABLET | Freq: Every day | ORAL | 0 refills | Status: DC

## 2017-04-07 MED ORDER — POTASSIUM CHLORIDE CRYS ER 10 MEQ PO TBCR: 10.0000 meq | EXTENDED_RELEASE_TABLET | Freq: Two times a day (BID) | ORAL | Status: DC

## 2017-04-07 MED ORDER — IRBESARTAN 75 MG PO TABS: 75.0000 mg | ORAL_TABLET | Freq: Every day | ORAL | 0 refills | Status: DC

## 2017-04-07 MED ORDER — SPIRONOLACTONE 12.5 MG HALF TABLET
12.5000 mg | ORAL_TABLET | Freq: Every day | ORAL | Status: DC
  Filled 2017-04-07: qty 1

## 2017-04-07 MED ORDER — ASPIRIN 81 MG PO TBEC: 81.0000 mg | DELAYED_RELEASE_TABLET | Freq: Every day | ORAL | 0 refills | Status: DC

## 2017-04-07 NOTE — Care Management Important Message (Signed)
Important Message  Patient Details  Name: Evelyn Riley MRN: [id number] Date of Birth: Mar 07, 1945   Medicare Important Message Given:  Yes    Orbie Pyo 04/07/2017, 12:40 PM

## 2017-04-07 NOTE — Care Management Note (Addendum)
Case Management Note  Patient Details  Name: Evelyn Riley MRN: [id number] Date of Birth: Jun 09, 1945  Subjective/Objective:    CHF               Action/Plan: Patient lives at home alone; No PCP, patient is agreeable to have CM assist her in finding a new PCP; apt made with Dr Grier Mitts with [facility] for March 8,2019 at 11 am; has private insurance with Medicare; pharmacy of choice is [facility]; she does not use any DME at this time.  3:16 pm - Patient needs a home health care nurse for wound care; [place] choice offered, pt chose [place]; Dan with [facility] called for arrangements. Aneta Mins [phone]  Expected Discharge Date:    possibly 04/07/2017              Expected Discharge Plan:  Home/Self Care  Discharge planning Services  Follow-up appt scheduled, CM Consult  Status of Service:  In process, will continue to follow  Sherrilyn Rist [phone] 04/07/2017, 1:53 PM

## 2017-04-07 NOTE — Progress Notes (Signed)
Patient and family given discharge instructions and all questions answered.   

## 2017-04-07 NOTE — Discharge Summary (Signed)
Physician Discharge Summary  Patient ID: Evelyn Riley MRN: [id number] DOB/AGE: [birth year] 72 y.o.  Admit date: 04/04/2017 Discharge date: 04/07/2017  Admission Diagnoses:  Discharge Diagnoses:  Active Problems:   Acute CHF (congestive heart failure) (HCC)   Pressure injury of skin   Acute on chronic combined systolic and diastolic CHF (congestive heart failure) (HCC)   Mass of right breast   Goals of care, counseling/discussion   Palliative care encounter   Discharged Condition: stable  Brief hospital course: Pt. with no known PMH; admitted on 04/04/2017, presented with complaint of leg swelling, was found to have acute on chronic .  Hospital Course:  1.Acute on chronic combined systolic and diastolic CHF with exacerbation: Patient was admitted for further assessment and management.  Patient was diuresed.  Echocardiogram done revealed an EF of 20-25%, akinesis of the anterior septal, anterior and anterolateral myocardium, grade 3 diastolic dysfunction, PA peak pressure of 47 mmHg.  ARB and beta-blocker will also added.  Patient's cardiac symptoms improved significantly.  However, due to right breast mass with possible metastasis, patient has opted opted not to pursue aggressive management.  2.Right breast lesion, with lesion in right first rib as well as mediastinal lymphadenopathy, T1 lucency. CT scan of the chest is positive for right breast mass with lymphadenopathy as well as involvement of T1 spine. This most likely is consistent with metastatic breast cancer.  Findings were discussed with the patient, as well as family, patient has opted not to pursue any chemotherapy or immunotherapy at any cost.  3.Mild hypokalemia. Will replace daily potassium.  4.  Mood disorder. Shows no severe signs of depression.  Mild anxiety given current diagnosis. Monitor, no suicidal ideation.   5.Bilateral leg edema as well as elevated d-dimer. Lower externally Doppler as well as CT  scan negative for PE and DVT. Monitor.  Diet: cardiac diet  Advance goals of care discussion: DNR/DNI.  Palliative care consulted for goals of care.  Possible hospice input was discussed as well.    Consults: cardiology  Significant Diagnostic Studies: Echo: Estimated EF is 20-25%, there is akinesis of the anteroseptal, anterior and anterolateral myocardium.  Doppler parameters are consistent with a reversible   restrictive pattern, indicative of decreased left ventricular diastolic compliance and/or increased left atrial pressure (grade 3 diastolic dysfunction). Systolic pressure was moderately increased.  PA peak pressure: 47 mm Hg    Follow-up: PCP and cardiology in 1-2 weeks.  Check BMP in PCP or cardiology office in 1-2 weeks (discussed with the cardiology team)   Discharge Exam: Blood pressure 136/76, pulse 69, temperature (!) 97.3 F (36.3 C), temperature source Oral, resp. rate 18, height 5\' 1"  (1.549 m), weight 63.1 kg (139 lb 3.2 oz), SpO2 97 %.   Disposition: Final discharge disposition not confirmed  Discharge Instructions    Call MD for:   Complete by:  As directed    Please call MD if the symptoms worsen.   Diet - low sodium heart healthy   Complete by:  As directed    Discharge instructions   Complete by:  As directed    Consider repeating BMP in PCP or cardiology office in 1-2 weeks.   Increase activity slowly   Complete by:  As directed      Allergies as of 04/07/2017   No Known Allergies     Medication List    STOP taking these medications   amLODipine 10 MG tablet Commonly known as:  NORVASC   ibuprofen 200 MG tablet Commonly known  as:  ADVIL,MOTRIN   lisinopril-hydrochlorothiazide 20-25 MG tablet Commonly known as:  PRINZIDE,ZESTORETIC   nebivolol 5 MG tablet Commonly known as:  BYSTOLIC     TAKE these medications   aspirin 81 MG EC tablet Take 1 tablet (81 mg total) by mouth daily. Start taking on:  04/08/2017   furosemide 40 MG  tablet Commonly known as:  LASIX Take 1 tablet (40 mg total) by mouth daily. Start taking on:  04/08/2017   irbesartan 75 MG tablet Commonly known as:  AVAPRO Take 1 tablet (75 mg total) by mouth daily. Start taking on:  04/08/2017   metoprolol succinate 25 MG 24 hr tablet Commonly known as:  TOPROL-XL Take 1 tablet (25 mg total) by mouth daily. Start taking on:  04/08/2017   potassium chloride 10 MEQ tablet Commonly known as:  K-DUR,KLOR-CON Take 1 tablet (10 mEq total) by mouth 2 (two) times daily.   spironolactone 25 MG tablet Commonly known as:  ALDACTONE Take 0.5 tablets (12.5 mg total) by mouth daily.      Follow-up Information    Billie Ruddy, MD Follow up on 04/21/2017.   Specialty:  Family Medicine Why:  at 11 am; please try to keep your apt or call to reschedule Contact information: [place] [address] [phone]           Signed: Bonnell Public 04/07/2017, 2:44 PM

## 2017-04-07 NOTE — Progress Notes (Signed)
No complaints or concerns from patient. Patient resting and expresses wishes to go home.

## 2017-04-07 NOTE — Progress Notes (Signed)
MD to sign patients DNR form located in patient chart on [place].

## 2017-04-07 NOTE — Progress Notes (Addendum)
Progress Note  Patient Name: Evelyn Riley Date of Encounter: 04/07/2017  Primary Cardiologist: new, Rekita Miotke   Subjective   She denies SOB, no history of chest pain. Her presenting complaint was edema which is improving.  Inpatient Medications    Scheduled Meds: . aspirin EC  81 mg Oral Daily  . enoxaparin (LOVENOX) injection  40 mg Subcutaneous Q24H  . furosemide  60 mg Intravenous BID  . irbesartan  75 mg Oral Daily  . metoprolol succinate  25 mg Oral Daily  . potassium chloride  40 mEq Oral BID  . sodium chloride flush  3 mL Intravenous Q12H   Continuous Infusions: . sodium chloride     PRN Meds: sodium chloride, acetaminophen, ondansetron (ZOFRAN) IV, sodium chloride flush   Vital Signs    Vitals:   04/06/17 1152 04/06/17 2100 04/07/17 0441 04/07/17 0800  BP: 131/82 120/62 122/63 139/79  Pulse: 72 73 74 72  Resp: 18 18 18 18   Temp: 97.9 F (36.6 C) 98.5 F (36.9 C) 98.4 F (36.9 C) 98 F (36.7 C)  TempSrc: Oral Oral Oral Oral  SpO2: 96% 96% 94% 93%  Weight:   139 lb 3.2 oz (63.1 kg)   Height:        Intake/Output Summary (Last 24 hours) at 04/07/2017 1046 Last data filed at 04/07/2017 1041 Gross per 24 hour  Intake 1083 ml  Output 3350 ml  Net -2267 ml   Filed Weights   04/05/17 0006 04/06/17 0714 04/07/17 0441  Weight: 143 lb 1.6 oz (64.9 kg) 142 lb 11.2 oz (64.7 kg) 139 lb 3.2 oz (63.1 kg)    Telemetry    Normal sinus rhythm- Personally Reviewed  ECG    Normal sinus rhythm- Personally Reviewed  Physical Exam   GEN: No acute distress. Poor dentition  Neck: No JVD Cardiac: RRR, 1/6 to 2/6 systolic ejection murmur Chest: palpable large firm mass, Rt chest  Respiratory: decreased breath sounds Rt base MS:  1-2+ pitting edema with chronic venous skin changes Neuro:  Nonfocal  Psych: Normal affect   Labs    Chemistry Recent Labs  Lab 04/04/17 1410 04/05/17 0521 04/06/17 0527 04/07/17 0551  NA 140 142 139 138  K 3.1* 3.6 3.5 3.6  CL  103 101 100* 96*  CO2 25 26 27 28   GLUCOSE 136* 150* 141* 105*  BUN 10 7 10 9   CREATININE 0.64 0.68 0.77 0.79  CALCIUM 9.2 8.9 8.7* 8.8*  PROT 7.0  --   --   --   ALBUMIN 3.2*  --   --   --   AST 26  --   --   --   ALT 22  --   --   --   ALKPHOS 124  --   --   --   BILITOT 2.6*  --   --   --   GFRNONAA >60 >60 >60 >60  GFRAA >60 >60 >60 >60  ANIONGAP 12 15 12 14      Hematology Recent Labs  Lab 04/04/17 1410 04/05/17 0521 04/06/17 0527  WBC 6.4 14.4* 9.2  RBC 4.98 4.71 4.61  HGB 15.4* 14.2 13.9  HCT 46.8* 44.1 43.3  MCV 94.0 93.6 93.9  MCH 30.9 30.1 30.2  MCHC 32.9 32.2 32.1  RDW 15.8* 15.7* 16.1*  PLT 217 223 214    Cardiac EnzymesNo results for input(s): TROPONINI in the last 168 hours. No results for input(s): TROPIPOC in the last 168 hours.   BNP Recent  Labs  Lab 04/04/17 1416  BNP 1,060.2*     DDimer No results for input(s): DDIMER in the last 168 hours.   Radiology    Chest CT 04/04/17- IMPRESSION: 1. Negative for acute pulmonary embolus. Cardiomegaly with moderate right-sided pleural effusion. 2. Large right subareolar breast mass concerning for breast carcinoma. Overlying nipple retraction. Multiple enlarged right axillary and subpectoral lymph nodes concerning for metastatic disease. 3. Enlarged mediastinal lymph nodes, suspect for metastatic disease 4. Multiple lucent and mixed lucent and sclerotic lesions within the spine as above concerning for osseous metastatic disease.  Cardiac Studies   Echo 04/05/17 Study Conclusions  - Left ventricle: The cavity size was normal. Wall thickness was   normal. Systolic function was severely reduced. The estimated   ejection fraction was in the range of 20% to 25%. There is   akinesis of the anteroseptal, anterior, and anterolateral   myocardium. Doppler parameters are consistent with a reversible   restrictive pattern, indicative of decreased left ventricular   diastolic compliance and/or increased  left atrial pressure (grade   3 diastolic dysfunction). - Aortic valve: Trileaflet; moderately thickened, moderately   calcified leaflets. Valve mobility was restricted. - Mitral valve: There was mild regurgitation. - Left atrium: The atrium was mildly dilated. - Right ventricle: The cavity size was mildly dilated. Wall   thickness was normal. - Right atrium: The atrium was mildly dilated. - Tricuspid valve: There was severe regurgitation. - Pulmonary arteries: Systolic pressure was moderately increased.   PA peak pressure: 47 mm Hg (S).   Patient Profile     72 y.o. female with no previous medical history, no medications prior to admission, admitted 04/04/17 with LE edema and found to have acute on chronic combined systolic and diastolic congestive heart failure and a new diagnosis of metastatic right breast cancer.  Assessment & Plan    1.  Acute on chronic systolic and diastolic congestive heart failure. The patient has diuresed 3.7 L, 4 lbs. Her legs are less swollen.  She is feeling quite a bit better but I think we could push for further diuresis although she tells me she just wants to go home.   Given the fact that she is currently declining work up of her breast cancer, we will take a conservative approach to her congestive heart failure.     2. Cardiomyopathy- Presumably ischemic She has inferior and anterior Q waves and WMA on echo. EF 20-25%  3. Diastolic dysfunction Grade 3 on echo  4. Breast cancer New diagnosis with suspected metastasis. At this time pt is declining a work up.    Plan: Consider changing Lasix to PO-pt idicates "I just want to go home". F/U with her PCP and cardiology.    For questions or updates, please contact [place] [place] Please consult [email or web address] for contact info under Cardiology/STEMI.      Signed, Kerin Ransom, PA-C   Attending Note:   The patient was seen and examined.  Agree with assessment and plan as noted above.  Changes made  to the above note as needed.  Patient seen and independently examined with Kerin Ransom , PA .   We discussed all aspects of the encounter. I agree with the assessment and plan as stated above.  1.   Acute on chronic combined S/D CHF:  Feeling better after diuresis She is on : Toprol XL 25 mg a day ,  Avapro 75 mg a day , Lasix ( changed to 40 mg PO today ) ,  Kdur ( changed to 10 meq BID today )  Aldactone 12.5 mg a day ( added today )  We discussed the issues with her breast cancer  - she does not want any treatment or further diagnosis. She wants to have her CHF treated so that she feels better.   Agree with a conservative approach to her CHF management.   She is not a candidate for invasive procedures.  Continue beta blocker, ARB, diuretics, Spironolactone   She is OK to be discharged today .   Follow up with [place] in a week or so  For office visit and BMP   2.  Breast cancer:   Does not want any further evaluation or treatment for this  She will need to get a primary MD to help her manage issues that may arise with this untreated breast cancer.  She has lost 50 lbs over the past year.   I've encouraged her to get a primary MD She should check with [facility].      I have spent a total of 40 minutes with patient reviewing hospital  notes , telemetry, EKGs, labs and examining patient as well as establishing an assessment and plan that was discussed with the patient. > 50% of time was spent in direct patient care.    Thayer Headings, Brooke Bonito., MD, [facility] 04/07/2017, 12:57 PM [address]  [place] Pager [phone]    04/07/2017, 10:46 AM

## 2017-04-08 DIAGNOSIS — R600 Localized edema: Secondary | ICD-10-CM | POA: Diagnosis not present

## 2017-04-08 DIAGNOSIS — F39 Unspecified mood [affective] disorder: Secondary | ICD-10-CM | POA: Diagnosis not present

## 2017-04-08 DIAGNOSIS — I5043 Acute on chronic combined systolic (congestive) and diastolic (congestive) heart failure: Secondary | ICD-10-CM | POA: Diagnosis not present

## 2017-04-08 DIAGNOSIS — I110 Hypertensive heart disease with heart failure: Secondary | ICD-10-CM | POA: Diagnosis not present

## 2017-04-08 DIAGNOSIS — I429 Cardiomyopathy, unspecified: Secondary | ICD-10-CM | POA: Diagnosis not present

## 2017-04-08 DIAGNOSIS — C50911 Malignant neoplasm of unspecified site of right female breast: Secondary | ICD-10-CM | POA: Diagnosis not present

## 2017-04-08 DIAGNOSIS — E876 Hypokalemia: Secondary | ICD-10-CM | POA: Diagnosis not present

## 2017-04-11 ENCOUNTER — Telehealth: Payer: Self-pay | Admitting: Cardiovascular Disease

## 2017-04-11 NOTE — Telephone Encounter (Signed)
New message    [facility] calling to verify if Dr Acie Fredrickson will be the MD signing order for patient. Please call

## 2017-04-12 ENCOUNTER — Telehealth: Payer: Self-pay | Admitting: Family Medicine

## 2017-04-12 NOTE — Telephone Encounter (Signed)
Copied from [place]. Topic: Inquiry >> Apr 12, 2017 10:15 AM Scherrie Gerlach wrote: Reason for CRM: Rosario Jacks with [facility] called to ask if there is any way Dr Volanda Napoleon, or any provider at the office, can see this pt sooner than 04/21/17. [facility] started her home health on Saturday, but there is no doctors to sign the orders. [facility] B[facility] could not get in touch with any dr because of the weekend. Pt was in the hospital and has not seen a dr in years.   Newly dx congestive heart failure [facility] states if not able to see sooner, they will drop the pt and re open her case after she sees the doctor. I could not find any available appts for anyone to schedule new pt sooner.  Per Dr. Carita Pian we do not have anything open any sooner than 04/21/2017. I called [facility] and spoke with Estill Bamberg, gave this information-she will notify patient and family.

## 2017-04-12 NOTE — Telephone Encounter (Signed)
Left detailed message for Evelyn Riley at [facility] that Dr. Acie Fredrickson is not the physician that will be signing the patient's home health orders and advised her to call back with additional questions or concerns

## 2017-04-14 ENCOUNTER — Ambulatory Visit (INDEPENDENT_AMBULATORY_CARE_PROVIDER_SITE_OTHER): Payer: Medicare Other | Admitting: Physician Assistant

## 2017-04-14 ENCOUNTER — Encounter: Payer: Self-pay | Admitting: Physician Assistant

## 2017-04-14 ENCOUNTER — Telehealth: Payer: Self-pay | Admitting: Cardiovascular Disease

## 2017-04-14 ENCOUNTER — Telehealth: Payer: Self-pay | Admitting: *Deleted

## 2017-04-14 VITALS — BP 148/82 | HR 70 | Ht 61.0 in | Wt 139.0 lb

## 2017-04-14 DIAGNOSIS — E876 Hypokalemia: Secondary | ICD-10-CM | POA: Diagnosis not present

## 2017-04-14 DIAGNOSIS — I420 Dilated cardiomyopathy: Secondary | ICD-10-CM | POA: Diagnosis not present

## 2017-04-14 DIAGNOSIS — I5042 Chronic combined systolic (congestive) and diastolic (congestive) heart failure: Secondary | ICD-10-CM | POA: Diagnosis not present

## 2017-04-14 DIAGNOSIS — C50919 Malignant neoplasm of unspecified site of unspecified female breast: Secondary | ICD-10-CM | POA: Diagnosis not present

## 2017-04-14 DIAGNOSIS — L97209 Non-pressure chronic ulcer of unspecified calf with unspecified severity: Secondary | ICD-10-CM | POA: Diagnosis not present

## 2017-04-14 DIAGNOSIS — I5043 Acute on chronic combined systolic (congestive) and diastolic (congestive) heart failure: Secondary | ICD-10-CM | POA: Diagnosis not present

## 2017-04-14 DIAGNOSIS — I429 Cardiomyopathy, unspecified: Secondary | ICD-10-CM | POA: Diagnosis not present

## 2017-04-14 DIAGNOSIS — I83002 Varicose veins of unspecified lower extremity with ulcer of calf: Secondary | ICD-10-CM

## 2017-04-14 DIAGNOSIS — I10 Essential (primary) hypertension: Secondary | ICD-10-CM

## 2017-04-14 DIAGNOSIS — C50911 Malignant neoplasm of unspecified site of right female breast: Secondary | ICD-10-CM | POA: Diagnosis not present

## 2017-04-14 DIAGNOSIS — R600 Localized edema: Secondary | ICD-10-CM | POA: Diagnosis not present

## 2017-04-14 DIAGNOSIS — I110 Hypertensive heart disease with heart failure: Secondary | ICD-10-CM | POA: Diagnosis not present

## 2017-04-14 LAB — BASIC METABOLIC PANEL
BUN/Creatinine Ratio: 22 (ref 12–28)
BUN: 15 mg/dL (ref 8–27)
CALCIUM: 8.9 mg/dL (ref 8.7–10.3)
CO2: 23 mmol/L (ref 20–29)
CREATININE: 0.67 mg/dL (ref 0.57–1.00)
Chloride: 103 mmol/L (ref 96–106)
GFR calc Af Amer: 102 mL/min/{1.73_m2} (ref >59)
GFR, EST NON AFRICAN AMERICAN: 89 mL/min/{1.73_m2} (ref >59)
GLUCOSE: 141 mg/dL — AB (ref 65–99)
Potassium: 4.4 mmol/L (ref 3.5–5.2)
SODIUM: 141 mmol/L (ref 134–144)

## 2017-04-14 MED ORDER — IRBESARTAN 75 MG PO TABS: 75.0000 mg | ORAL_TABLET | Freq: Every day | ORAL | 3 refills | Status: DC

## 2017-04-14 MED ORDER — SPIRONOLACTONE 25 MG PO TABS: 12.5000 mg | ORAL_TABLET | Freq: Every day | ORAL | 3 refills | Status: AC

## 2017-04-14 MED ORDER — POTASSIUM CHLORIDE CRYS ER 10 MEQ PO TBCR: 10.0000 meq | EXTENDED_RELEASE_TABLET | Freq: Two times a day (BID) | ORAL | 3 refills | Status: AC

## 2017-04-14 MED ORDER — METOPROLOL SUCCINATE ER 50 MG PO TB24: 50.0000 mg | ORAL_TABLET | Freq: Every day | ORAL | 3 refills | Status: DC

## 2017-04-14 MED ORDER — FUROSEMIDE 40 MG PO TABS: 40.0000 mg | ORAL_TABLET | Freq: Every day | ORAL | 3 refills | Status: AC

## 2017-04-14 NOTE — Patient Instructions (Signed)
Medication Instructions:  1. INCREASE TOPROL XL TO 50 MG DAILY; NEW RX HAS BEEN SENT IN FOR THE 50 MG TABLET  REFILLS HAVE BEEN SENT IN FOR AVAPRO, LASIX, POTASSIUM, SPIRONOLACTONE  Labwork: TODAY BMET  Testing/Procedures: NONE ORDERED TODAY  Follow-Up: KEEP YOUR APPT WITH DR. Acie Fredrickson 05/01/17  Any Other Special Instructions Will Be Listed Below (If Applicable). I WILL CALL Rosario Jacks, RN WITH [facility] ABOUT APPROVAL FOR 2 WEEKS THROUGH [place] [facility], [facility]     If you need a refill on your cardiac medications before your next appointment, please call your pharmacy.

## 2017-04-14 NOTE — Telephone Encounter (Signed)
Spoke with Colletta Maryland with [facility]. I reviewed the patient's chart and spoke with Julaine Hua, [place] who works with Richardson Dopp, PA and she advised that [facility] asked that they continue current treatment until patient sees PCP in 2 weeks. Colletta Maryland verbalized understanding and agreement. Ivett Luebbe states that orders were faxed to [facility] this morning, ATTN: [facility] Bamberg. Colletta Maryland thanked me for my help.

## 2017-04-14 NOTE — Telephone Encounter (Signed)
NEW MESSAGE   Colletta Maryland calling from [facility] [phone] [facility] calling for wound care orders, vaseline and gauze.  Please call  Fax # [phone] Attn: Kathe Mariner

## 2017-04-14 NOTE — Progress Notes (Signed)
Cardiology Office Note:    Date:  04/14/2017   ID:  Evelyn Riley, DOB 1945/05/01, MRN [id number]  PCP:  Orlena Sheldon, PA-C  Cardiologist:  Mertie Moores, MD   Referring MD: Orlena Sheldon, PA-C   Chief Complaint  Patient presents with  . Hospitalization Follow-up    Admitted with heart failure    History of Present Illness:    Evelyn Riley is a 72 y.o. female who was admitted 2/19-2/22 with acute combined systolic and diastolic heart failure.  Echocardiogram demonstrated EF 25-30% with multiple wall motion abnormalities and grade 3 diastolic dysfunction.  Cardiomyopathy is presumed to be ischemic.  CT scan demonstrated widely metastatic breast cancer.  She declined further evaluation or treatment for breast cancer.  In light of this, it was felt that conservative therapy should be pursued for her heart failure.  She was evaluated by palliative care and made DNR.  Outpatient palliative versus hospice care at home was recommended.  [name] returns for follow-up.  She is here today with her son.  Since discharge from the hospital, she has been doing well.  She denies significant shortness of breath.  She denies chest discomfort.  She denies orthopnea, PND.  Lower extremity edema is much improved.  She denies syncope.  She did have some venous stasis ulcers on her lower extremities.  Her son has been changing the dressings.  She also has what sounds like a decubitus ulcer.  Home health nursing did come out last week.  However, since she does not have primary care, her home health order has not been signed.  Prior CV studies:   The following studies were reviewed today:  Echo 04/05/17 EF 20-25, anteroseptal/anterior/anterolateral akinesis, grade 3 diastolic dysfunction, mild MR, mild LAE, mild RAE, severe TR, PASP 47  Chest CTA 04/04/17 IMPRESSION: 1. Negative for acute pulmonary embolus. Cardiomegaly with moderate right-sided pleural effusion. 2. Large right subareolar breast mass concerning  for breast carcinoma. Overlying nipple retraction. Multiple enlarged right axillary and subpectoral lymph nodes concerning for metastatic disease. 3. Enlarged mediastinal lymph nodes, suspect for metastatic disease 4. Multiple lucent and mixed lucent and sclerotic lesions within the spine as above concerning for osseous metastatic disease. 5. Aortic Atherosclerosis (ICD10-I70.0).  Past Medical History:  Diagnosis Date  . Hypertension     History reviewed. No pertinent surgical history.  Current Medications: Current Meds  Medication Sig  . aspirin 81 MG EC tablet Take 1 tablet (81 mg total) by mouth daily.  . furosemide (LASIX) 40 MG tablet Take 1 tablet (40 mg total) by mouth daily.  . irbesartan (AVAPRO) 75 MG tablet Take 1 tablet (75 mg total) by mouth daily.  . potassium chloride (K-DUR,KLOR-CON) 10 MEQ tablet Take 1 tablet (10 mEq total) by mouth 2 (two) times daily.  Marland Kitchen spironolactone (ALDACTONE) 25 MG tablet Take 0.5 tablets (12.5 mg total) by mouth daily.  . [DISCONTINUED] metoprolol succinate (TOPROL-XL) 25 MG 24 hr tablet Take 1 tablet (25 mg total) by mouth daily.     Allergies:   Patient has no known allergies.   Social History   Tobacco Use  . Smoking status: Never Smoker  . Smokeless tobacco: Never Used  Substance Use Topics  . Alcohol use: No  . Drug use: No     Family Hx: The patient's family history includes Heart disease (age of onset: 65) in her father; Hypertension in her mother.  ROS:   Please see the history of present illness.    ROS  All other systems reviewed and are negative.   EKGs/Labs/Other Test Reviewed:    EKG:  EKG is  ordered today.  The ekg ordered today demonstrates normal sinus rhythm, heart rate 70, left axis inferior Q waves, anterior Q waves, T wave inversions 1, aVL, QTC 427 ms  Recent Labs: 04/04/2017: ALT 22; B Natriuretic Peptide 1,060.2 04/06/2017: Hemoglobin 13.9; Platelets 214 04/07/2017: BUN 9; Creatinine, Ser 0.79;  Magnesium 1.4; Potassium 3.6; Sodium 138   Recent Lipid Panel No results found for: CHOL, TRIG, HDL, CHOLHDL, LDLCALC, LDLDIRECT  Physical Exam:    VS:  BP (!) 148/82   Pulse 70   Ht 5\' 1"  (1.549 m)   Wt 139 lb (63 kg)   SpO2 92%   BMI 26.26 kg/m     Wt Readings from Last 3 Encounters:  04/14/17 139 lb (63 kg)  04/07/17 139 lb 3.2 oz (63.1 kg)  07/13/12 173 lb (78.5 kg)     Physical Exam  Constitutional: She is oriented to person, place, and time. She appears well-developed and well-nourished. No distress.  HENT:  Head: Normocephalic and atraumatic.  Neck: No JVD present.  Cardiovascular: Normal rate and regular rhythm.  No murmur heard. Pulmonary/Chest: Effort normal. She has no rales.  Abdominal: Soft.  Musculoskeletal: She exhibits edema (trace-1+ brawny bilat LE edema).  Neurological: She is alert and oriented to person, place, and time.  Skin:  Wound on R calf observed - appears to be stage 1-2; no malodorous discharge    ASSESSMENT & PLAN:    #1.  Chronic combined systolic and diastolic congestive heart failure (HCC)  EF 20-25 with grade 3 diastolic dysfunction by echocardiogram 2/19.  She is NYHA 2.  Conservative therapy has been chosen given her metastatic malignancy and decision to pursue conservative management.  Continue ARB, beta-blocker, aldosterone antagonist.  Continue current dose of furosemide.  Obtain BMET today.  Increase metoprolol succinate to 50 mg daily.  #2.  Dilated cardiomyopathy ([place]) Presumed to be ischemic given her wall motion abnormalities.  However, she is being managed conservatively.  No ischemic testing is warranted.  #3.  Essential hypertension Blood pressure above target.  Adjust beta-blocker as noted above.  Continue current dose of irbesartan and spironolactone.  #4.  Venous stasis ulcer She has venous stasis ulcers on bilateral calves.  She also has what sounds like a decubitus ulcer.  Home health nursing did come out last week  but will not come back unless her orders are signed.  She is not yet established with primary care.  We will see if home health will allow [place] to sign her orders for 2 weeks until she can get into see primary care.  #5.  Metastatic breast cancer [facility]) She has opted for no further workup or management.  She was seen by palliative care in the hospital.  She establishes with primary care next week.   Dispo:  Return in about 2 weeks (around 05/01/2017) for Scheduled Follow Up, w/ Dr. Acie Fredrickson.   Medication Adjustments/Labs and Tests Ordered: Current medicines are reviewed at length with the patient today.  Concerns regarding medicines are outlined above.  Tests Ordered: Orders Placed This Encounter  Procedures  . Basic Metabolic Panel (BMET)  . EKG 12-Lead   Medication Changes: Meds ordered this encounter  Medications  . metoprolol succinate (TOPROL XL) 50 MG 24 hr tablet    Sig: Take 1 tablet (50 mg total) by mouth daily. Take with or immediately following a meal.  Dispense:  90 tablet    Refill:  3  . furosemide (LASIX) 40 MG tablet    Sig: Take 1 tablet (40 mg total) by mouth daily.    Dispense:  90 tablet    Refill:  3  . irbesartan (AVAPRO) 75 MG tablet    Sig: Take 1 tablet (75 mg total) by mouth daily.    Dispense:  90 tablet    Refill:  3  . potassium chloride (K-DUR,KLOR-CON) 10 MEQ tablet    Sig: Take 1 tablet (10 mEq total) by mouth 2 (two) times daily.    Dispense:  180 tablet    Refill:  3  . spironolactone (ALDACTONE) 25 MG tablet    Sig: Take 0.5 tablets (12.5 mg total) by mouth daily.    Dispense:  90 tablet    Refill:  3    Signed, Richardson Dopp, PA-C  04/14/2017 1:08 PM    [facility] [place], [place], [place]  [address] Phone: [phone]; Fax: [phone]

## 2017-04-14 NOTE — Telephone Encounter (Signed)
Pt has been notified of lab results by phone with verbal understanding. Pt advised to f/u with PCP in regards to new elevated glucose levels. Pt is agreeable to plan of care. Pt thanked me for my call today.

## 2017-04-14 NOTE — Telephone Encounter (Signed)
-----   Message from Liliane Shi, [place] sent at 04/14/2017  4:58 PM EST ----- Renal function, potassium normal.  Glucose elevated.   Continue current medications and follow up as planned.  Follow up with new PCP for elevated glucose. Richardson Dopp, PA-C    04/14/2017 4:57 PM

## 2017-04-21 ENCOUNTER — Ambulatory Visit: Payer: Medicare Other | Admitting: Family Medicine

## 2017-04-21 ENCOUNTER — Encounter (HOSPITAL_COMMUNITY): Payer: Self-pay

## 2017-04-21 ENCOUNTER — Emergency Department (HOSPITAL_COMMUNITY): Payer: Medicare Other

## 2017-04-21 ENCOUNTER — Inpatient Hospital Stay (HOSPITAL_COMMUNITY)
Admission: EM | Admit: 2017-04-21 | Discharge: 2017-04-23 | DRG: 871 | Disposition: A | Discharge status: Home Health | Payer: Medicare Other | Attending: Family Medicine | Admitting: Family Medicine

## 2017-04-21 ENCOUNTER — Other Ambulatory Visit: Payer: Self-pay

## 2017-04-21 DIAGNOSIS — C773 Secondary and unspecified malignant neoplasm of axilla and upper limb lymph nodes: Secondary | ICD-10-CM | POA: Diagnosis present

## 2017-04-21 DIAGNOSIS — C50911 Malignant neoplasm of unspecified site of right female breast: Secondary | ICD-10-CM | POA: Diagnosis not present

## 2017-04-21 DIAGNOSIS — G9341 Metabolic encephalopathy: Secondary | ICD-10-CM | POA: Diagnosis present

## 2017-04-21 DIAGNOSIS — Z7982 Long term (current) use of aspirin: Secondary | ICD-10-CM | POA: Diagnosis not present

## 2017-04-21 DIAGNOSIS — R404 Transient alteration of awareness: Secondary | ICD-10-CM | POA: Diagnosis not present

## 2017-04-21 DIAGNOSIS — Z8249 Family history of ischemic heart disease and other diseases of the circulatory system: Secondary | ICD-10-CM | POA: Diagnosis not present

## 2017-04-21 DIAGNOSIS — E876 Hypokalemia: Secondary | ICD-10-CM | POA: Diagnosis present

## 2017-04-21 DIAGNOSIS — Z79899 Other long term (current) drug therapy: Secondary | ICD-10-CM

## 2017-04-21 DIAGNOSIS — A419 Sepsis, unspecified organism: Secondary | ICD-10-CM | POA: Diagnosis not present

## 2017-04-21 DIAGNOSIS — R1084 Generalized abdominal pain: Secondary | ICD-10-CM | POA: Diagnosis not present

## 2017-04-21 DIAGNOSIS — I5022 Chronic systolic (congestive) heart failure: Secondary | ICD-10-CM

## 2017-04-21 DIAGNOSIS — I420 Dilated cardiomyopathy: Secondary | ICD-10-CM | POA: Diagnosis present

## 2017-04-21 DIAGNOSIS — E860 Dehydration: Secondary | ICD-10-CM | POA: Diagnosis present

## 2017-04-21 DIAGNOSIS — R7881 Bacteremia: Secondary | ICD-10-CM | POA: Diagnosis not present

## 2017-04-21 DIAGNOSIS — G92 Toxic encephalopathy: Secondary | ICD-10-CM

## 2017-04-21 DIAGNOSIS — C7951 Secondary malignant neoplasm of bone: Secondary | ICD-10-CM | POA: Diagnosis present

## 2017-04-21 DIAGNOSIS — I110 Hypertensive heart disease with heart failure: Secondary | ICD-10-CM | POA: Diagnosis present

## 2017-04-21 DIAGNOSIS — J90 Pleural effusion, not elsewhere classified: Secondary | ICD-10-CM | POA: Diagnosis not present

## 2017-04-21 DIAGNOSIS — A409 Streptococcal sepsis, unspecified: Principal | ICD-10-CM | POA: Diagnosis present

## 2017-04-21 DIAGNOSIS — Z66 Do not resuscitate: Secondary | ICD-10-CM | POA: Diagnosis present

## 2017-04-21 DIAGNOSIS — J181 Lobar pneumonia, unspecified organism: Secondary | ICD-10-CM | POA: Diagnosis not present

## 2017-04-21 DIAGNOSIS — A400 Sepsis due to streptococcus, group A: Secondary | ICD-10-CM | POA: Diagnosis not present

## 2017-04-21 DIAGNOSIS — J189 Pneumonia, unspecified organism: Secondary | ICD-10-CM | POA: Diagnosis not present

## 2017-04-21 DIAGNOSIS — I5042 Chronic combined systolic (congestive) and diastolic (congestive) heart failure: Secondary | ICD-10-CM | POA: Diagnosis present

## 2017-04-21 DIAGNOSIS — R000 Tachycardia, unspecified: Secondary | ICD-10-CM | POA: Diagnosis not present

## 2017-04-21 DIAGNOSIS — R531 Weakness: Secondary | ICD-10-CM | POA: Diagnosis not present

## 2017-04-21 LAB — MAGNESIUM: Magnesium: 1.5 mg/dL — ABNORMAL LOW (ref 1.7–2.4)

## 2017-04-21 LAB — PROCALCITONIN: Procalcitonin: 5.24 ng/mL

## 2017-04-21 LAB — COMPREHENSIVE METABOLIC PANEL
ALBUMIN: 3.2 g/dL — AB (ref 3.5–5.0)
ALK PHOS: 161 U/L — AB (ref 38–126)
ALT: 24 U/L (ref 14–54)
AST: 32 U/L (ref 15–41)
Anion gap: 12 (ref 5–15)
BUN: 18 mg/dL (ref 6–20)
CALCIUM: 9 mg/dL (ref 8.9–10.3)
CHLORIDE: 104 mmol/L (ref 101–111)
CO2: 22 mmol/L (ref 22–32)
CREATININE: 0.79 mg/dL (ref 0.44–1.00)
GFR calc non Af Amer: >60 mL/min (ref >60)
GLUCOSE: 228 mg/dL — AB (ref 65–99)
Potassium: 3.9 mmol/L (ref 3.5–5.1)
SODIUM: 138 mmol/L (ref 135–145)
Total Bilirubin: 3.8 mg/dL — ABNORMAL HIGH (ref 0.3–1.2)
Total Protein: 7.7 g/dL (ref 6.5–8.1)

## 2017-04-21 LAB — URINALYSIS, ROUTINE W REFLEX MICROSCOPIC
BILIRUBIN URINE: NEGATIVE
GLUCOSE, UA: NEGATIVE mg/dL
KETONES UR: 5 mg/dL — AB
Leukocytes, UA: NEGATIVE
NITRITE: NEGATIVE
PROTEIN: 100 mg/dL — AB
Specific Gravity, Urine: 1.026 (ref 1.005–1.030)
pH: 5 (ref 5.0–8.0)

## 2017-04-21 LAB — PROTIME-INR
INR: 1.06
INR: 1.22
Prothrombin Time: 13.7 seconds (ref 11.4–15.2)
Prothrombin Time: 15.3 seconds — ABNORMAL HIGH (ref 11.4–15.2)

## 2017-04-21 LAB — I-STAT CG4 LACTIC ACID, ED
LACTIC ACID, VENOUS: 2.74 mmol/L — AB (ref 0.5–1.9)
Lactic Acid, Venous: 4 mmol/L (ref 0.5–1.9)

## 2017-04-21 LAB — CBC WITH DIFFERENTIAL/PLATELET
BASOS ABS: 0 10*3/uL (ref 0.0–0.1)
Basophils Relative: 0 %
EOS PCT: 0 %
Eosinophils Absolute: 0 10*3/uL (ref 0.0–0.7)
HCT: 47.7 % — ABNORMAL HIGH (ref 36.0–46.0)
Hemoglobin: 16.1 g/dL — ABNORMAL HIGH (ref 12.0–15.0)
LYMPHS ABS: 0.6 10*3/uL — AB (ref 0.7–4.0)
LYMPHS PCT: 4 %
MCH: 31.5 pg (ref 26.0–34.0)
MCHC: 33.8 g/dL (ref 30.0–36.0)
MCV: 93.3 fL (ref 78.0–100.0)
MONO ABS: 0.7 10*3/uL (ref 0.1–1.0)
Monocytes Relative: 5 %
Neutro Abs: 13.7 10*3/uL — ABNORMAL HIGH (ref 1.7–7.7)
Neutrophils Relative %: 91 %
Platelets: 208 10*3/uL (ref 150–400)
RBC: 5.11 MIL/uL (ref 3.87–5.11)
RDW: 15.3 % (ref 11.5–15.5)
WBC: 15 10*3/uL — ABNORMAL HIGH (ref 4.0–10.5)

## 2017-04-21 LAB — LACTIC ACID, PLASMA
Lactic Acid, Venous: 2.4 mmol/L (ref 0.5–1.9)
Lactic Acid, Venous: 3.2 mmol/L (ref 0.5–1.9)

## 2017-04-21 LAB — MRSA PCR SCREENING: MRSA by PCR: NEGATIVE

## 2017-04-21 LAB — PHOSPHORUS: Phosphorus: 3 mg/dL (ref 2.5–4.6)

## 2017-04-21 LAB — APTT: aPTT: 38 seconds — ABNORMAL HIGH (ref 24–36)

## 2017-04-21 MED ORDER — ACETAMINOPHEN 325 MG PO TABS
650.0000 mg | ORAL_TABLET | Freq: Once | ORAL | Status: AC
  Administered 2017-04-21: 650 mg via ORAL
  Filled 2017-04-21: qty 2

## 2017-04-21 MED ORDER — CEFTRIAXONE SODIUM 1 G IJ SOLR
1.0000 g | Freq: Once | INTRAMUSCULAR | Status: AC
  Administered 2017-04-21: 1 g via INTRAVENOUS
  Filled 2017-04-21: qty 10

## 2017-04-21 MED ORDER — ENOXAPARIN SODIUM 40 MG/0.4ML ~~LOC~~ SOLN
40.0000 mg | SUBCUTANEOUS | Status: DC
  Administered 2017-04-21: 40 mg via SUBCUTANEOUS
  Filled 2017-04-21 (×2): qty 0.4

## 2017-04-21 MED ORDER — VANCOMYCIN HCL 10 G IV SOLR
1500.0000 mg | Freq: Once | INTRAVENOUS | Status: AC
  Administered 2017-04-21: 1500 mg via INTRAVENOUS
  Filled 2017-04-21: qty 1500

## 2017-04-21 MED ORDER — VANCOMYCIN HCL IN DEXTROSE 750-5 MG/150ML-% IV SOLN: 750.0000 mg | INTRAVENOUS | Status: DC

## 2017-04-21 MED ORDER — SODIUM CHLORIDE 0.9 % IV SOLN
INTRAVENOUS | Status: AC
  Administered 2017-04-21: 1 mL via INTRAVENOUS
  Administered 2017-04-22: 07:00:00 via INTRAVENOUS

## 2017-04-21 MED ORDER — VANCOMYCIN HCL IN DEXTROSE 1-5 GM/200ML-% IV SOLN: 1000.0000 mg | Freq: Once | INTRAVENOUS | Status: DC

## 2017-04-21 MED ORDER — METOPROLOL SUCCINATE ER 50 MG PO TB24
50.0000 mg | ORAL_TABLET | Freq: Every day | ORAL | Status: DC
  Administered 2017-04-21 – 2017-04-23 (×3): 50 mg via ORAL
  Filled 2017-04-21 (×2): qty 2
  Filled 2017-04-21: qty 1

## 2017-04-21 MED ORDER — SODIUM CHLORIDE 0.9 % IV BOLUS (SEPSIS)
500.0000 mL | Freq: Once | INTRAVENOUS | Status: AC
  Administered 2017-04-21: 500 mL via INTRAVENOUS

## 2017-04-21 MED ORDER — IRBESARTAN 75 MG PO TABS
75.0000 mg | ORAL_TABLET | Freq: Every day | ORAL | Status: DC
  Administered 2017-04-21 – 2017-04-23 (×3): 75 mg via ORAL
  Filled 2017-04-21 (×3): qty 1

## 2017-04-21 MED ORDER — SODIUM CHLORIDE 0.9 % IV SOLN
2.0000 g | Freq: Once | INTRAVENOUS | Status: AC
  Administered 2017-04-21: 2 g via INTRAVENOUS
  Filled 2017-04-21: qty 2

## 2017-04-21 MED ORDER — LIP MEDEX EX OINT
TOPICAL_OINTMENT | CUTANEOUS | Status: DC | PRN
  Filled 2017-04-21: qty 7

## 2017-04-21 MED ORDER — SPIRONOLACTONE 12.5 MG HALF TABLET
12.5000 mg | ORAL_TABLET | Freq: Every day | ORAL | Status: DC
  Administered 2017-04-21 – 2017-04-23 (×3): 12.5 mg via ORAL
  Filled 2017-04-21 (×3): qty 1

## 2017-04-21 MED ORDER — CEFEPIME HCL 1 G IJ SOLR
1.0000 g | Freq: Three times a day (TID) | INTRAMUSCULAR | Status: DC
  Administered 2017-04-22: 1 g via INTRAVENOUS
  Filled 2017-04-21 (×2): qty 1

## 2017-04-21 MED ORDER — ASPIRIN EC 81 MG PO TBEC
81.0000 mg | DELAYED_RELEASE_TABLET | Freq: Every day | ORAL | Status: DC
  Administered 2017-04-21 – 2017-04-23 (×3): 81 mg via ORAL
  Filled 2017-04-21 (×3): qty 1

## 2017-04-21 NOTE — ED Triage Notes (Addendum)
Pt arrives via EMS from home, where she lives alone. Per EMS, pt's son found pt sleeping in bedroom closet, incontinent with "a srtong UTI smell. Pt's son reports pt has been increasingly weak, shaky, loss of appetite over the past 1-2 days. Py incontinent of stool upon arrival to this facility and oriented to self, place, and situation.

## 2017-04-21 NOTE — ED Notes (Signed)
ED Provider at bedside. 

## 2017-04-21 NOTE — ED Notes (Signed)
ED TO INPATIENT HANDOFF REPORT  Name/Age/Gender Evelyn Riley 72 y.o. female  Code Status Code Status History    Date Active Date Inactive Code Status Order ID Comments User Context   04/05/2017 16:22 04/07/2017 18:51 DNR [id number]  Lavina Hamman, MD Inpatient   04/04/2017 23:22 04/05/2017 16:22 Full Code [id number]  Lavina Hamman, MD ED    Questions for Most Recent Historical Code Status (Order [id number])    Question Answer Comment   In the event of cardiac or respiratory ARREST Do not call a "code blue"    In the event of cardiac or respiratory ARREST Do not perform Intubation, CPR, defibrillation or ACLS    In the event of cardiac or respiratory ARREST Use medication by any route, position, wound care, and other measures to relive pain and suffering. May use oxygen, suction and manual treatment of airway obstruction as needed for comfort.       Home/SNF/Other Home  Chief Complaint possible UTI / weakness   Level of Care/Admitting Diagnosis ED Disposition    ED Disposition Condition Comment   Admit  Hospital Area: [facility] [[id number]]  Level of Care: Stepdown [14]  Admit to SDU based on following criteria: Severe physiological/psychological symptoms:  Any diagnosis requiring assessment & intervention at least every 4 hours on an ongoing basis to obtain desired patient outcomes including stability and rehabilitation  Diagnosis: Sepsis [facility]) [[id number]]  Admitting Physician: Bonnell Public [[id number]]  Attending Physician: Dana Allan I [[id number]]  Estimated length of stay: past midnight tomorrow  Certification:: I certify this patient will need inpatient services for at least 2 midnights  PT Class (Do Not Modify): Inpatient [101]  PT Acc Code (Do Not Modify): Private [1]       Medical History Past Medical History:  Diagnosis Date  . Hypertension     Allergies No Known Allergies  IV Location/Drains/Wounds Patient Lines/Drains/Airways Status    Active Line/Drains/Airways    Name:   Placement date:   Placement time:   Site:   Days:   Peripheral IV 04/21/17 Left Hand   04/21/17    1150    Hand   less than 1   Peripheral IV 04/21/17 Right Forearm   04/21/17    1226    Forearm   less than 1   External Urinary Catheter   04/21/17    1219    -   less than 1   Pressure Injury 04/05/17 Stage II -  Partial thickness loss of dermis presenting as a shallow open ulcer with a red, pink wound bed without slough. red, non blanchable, small yellow wound; this is patchy areas of shear and moisture associated skin damage   04/05/17    0216     16   Wound / Incision (Open or Dehisced) 04/05/17 Non-pressure wound Leg Right;Left;Lower Red, weeping, open areas   04/05/17    0214    Leg   16          Labs/Imaging Results for orders placed or performed during the hospital encounter of 04/21/17 (from the past 48 hour(s))  Comprehensive metabolic panel     Status: Abnormal   Collection Time: 04/21/17 12:27 PM  Result Value Ref Range   Sodium 138 135 - 145 mmol/L   Potassium 3.9 3.5 - 5.1 mmol/L   Chloride 104 101 - 111 mmol/L   CO2 22 22 - 32 mmol/L   Glucose, Bld 228 (H) 65 - 99 mg/dL   BUN  18 6 - 20 mg/dL   Creatinine, Ser 0.79 0.44 - 1.00 mg/dL   Calcium 9.0 8.9 - 10.3 mg/dL   Total Protein 7.7 6.5 - 8.1 g/dL   Albumin 3.2 (L) 3.5 - 5.0 g/dL   AST 32 15 - 41 U/L   ALT 24 14 - 54 U/L   Alkaline Phosphatase 161 (H) 38 - 126 U/L   Total Bilirubin 3.8 (H) 0.3 - 1.2 mg/dL   GFR calc non Af Amer >60 >60 mL/min   GFR calc Af Amer >60 >60 mL/min    Comment: (NOTE) The eGFR has been calculated using the CKD EPI equation. This calculation has not been validated in all clinical situations. eGFR's persistently <60 mL/min signify possible Chronic Kidney Disease.    Anion gap 12 5 - 15    Comment: Performed at [facility], [place] [address], [place], [place] [address]  CBC with Differential     Status: Abnormal   Collection Time:  04/21/17 12:27 PM  Result Value Ref Range   WBC 15.0 (H) 4.0 - 10.5 K/uL   RBC 5.11 3.87 - 5.11 MIL/uL   Hemoglobin 16.1 (H) 12.0 - 15.0 g/dL   HCT 47.7 (H) 36.0 - 46.0 %   MCV 93.3 78.0 - 100.0 fL   MCH 31.5 26.0 - 34.0 pg   MCHC 33.8 30.0 - 36.0 g/dL   RDW 15.3 11.5 - 15.5 %   Platelets 208 150 - 400 K/uL   Neutrophils Relative % 91 %   Neutro Abs 13.7 (H) 1.7 - 7.7 K/uL   Lymphocytes Relative 4 %   Lymphs Abs 0.6 (L) 0.7 - 4.0 K/uL   Monocytes Relative 5 %   Monocytes Absolute 0.7 0.1 - 1.0 K/uL   Eosinophils Relative 0 %   Eosinophils Absolute 0.0 0.0 - 0.7 K/uL   Basophils Relative 0 %   Basophils Absolute 0.0 0.0 - 0.1 K/uL    Comment: Performed at [facility], [place] [address] [place], [place] [address]  Protime-INR     Status: None   Collection Time: 04/21/17 12:27 PM  Result Value Ref Range   Prothrombin Time 13.7 11.4 - 15.2 seconds   INR 1.06     Comment: Performed at [facility], [place] [address], [place], [place] [address]  Urinalysis, Routine w reflex microscopic     Status: Abnormal   Collection Time: 04/21/17 12:27 PM  Result Value Ref Range   Color, Urine AMBER (A) YELLOW    Comment: BIOCHEMICALS MAY BE AFFECTED BY COLOR   APPearance CLEAR CLEAR   Specific Gravity, Urine 1.026 1.005 - 1.030   pH 5.0 5.0 - 8.0   Glucose, UA NEGATIVE NEGATIVE mg/dL   Hgb urine dipstick SMALL (A) NEGATIVE   Bilirubin Urine NEGATIVE NEGATIVE   Ketones, ur 5 (A) NEGATIVE mg/dL   Protein, ur 100 (A) NEGATIVE mg/dL   Nitrite NEGATIVE NEGATIVE   Leukocytes, UA NEGATIVE NEGATIVE   RBC / HPF 0-5 0 - 5 RBC/hpf   WBC, UA 0-5 0 - 5 WBC/hpf   Bacteria, UA RARE (A) NONE SEEN   Squamous Epithelial / LPF 0-5 (A) NONE SEEN   Mucus PRESENT     Comment: Performed at [facility], [place] [address] [place],  [address]  I-Stat CG4 Lactic Acid, ED     Status: Abnormal   Collection Time: 04/21/17 12:30 PM  Result Value Ref Range    Lactic Acid, Venous 4.00 (HH)  0.5 - 1.9 mmol/L   Comment NOTIFIED PHYSICIAN   I-Stat CG4 Lactic Acid, ED     Status: Abnormal   Collection Time: 04/21/17  2:30 PM  Result Value Ref Range   Lactic Acid, Venous 2.74 (HH) 0.5 - 1.9 mmol/L   Comment NOTIFIED PHYSICIAN    Dg Chest 2 View  Result Date: 04/21/2017 CLINICAL DATA:  Low oxygen saturation. EXAM: CHEST - 2 VIEW COMPARISON:  04/04/2017 chest radiograph and CT. FINDINGS: Stable fullness in the right hilum. Persistent right basilar densities compatible with pleural fluid and volume loss/consolidation. Heart size remains enlarged. No significant airspace disease or consolidation in the left lung. Atherosclerotic calcifications at the aortic arch. Old right seventh rib fracture. IMPRESSION: Persistent pleural and parenchymal densities in the right lower chest. Findings are compatible with right pleural effusion and consolidation/volume loss. Findings are similar to the previous examination. Electronically Signed   By: Markus Daft M.D.   On: 04/21/2017 13:24    Pending Labs Unresulted Labs (From admission, onward)   Start     Ordered   04/21/17 1204  Culture, blood (Routine x 2)  BLOOD CULTURE X 2,   STAT     04/21/17 1204   04/21/17 1204  Urine culture  STAT,   STAT     04/21/17 1204      Vitals/Pain Today's Vitals   04/21/17 1429 04/21/17 1430 04/21/17 1500 04/21/17 1525  BP:  122/71 108/63 109/60  Pulse: 89 87 86 86  Resp: 16   20  Temp: 99.5 F (37.5 C)     TempSrc: Oral     SpO2: 96% 97% 96% 96%  Weight:      Height:        Isolation Precautions No active isolations  Medications Medications  acetaminophen (TYLENOL) tablet 650 mg (650 mg Oral Given 04/21/17 1245)  cefTRIAXone (ROCEPHIN) 1 g in sodium chloride 0.9 % 100 mL IVPB (0 g Intravenous Stopped 04/21/17 1335)  sodium chloride 0.9 % bolus 500 mL (0 mLs Intravenous Stopped 04/21/17 1633)  sodium chloride 0.9 % bolus 500 mL (0 mLs Intravenous Stopped 04/21/17 1425)     Mobility non-ambulatory

## 2017-04-21 NOTE — Progress Notes (Signed)
Pt stated it was ok for her son and daughter in law to remain in the room while the questions on the nursing admission history were asked. Lucius Conn BSN, RN-BC Admissions RN 04/21/2017 4:09 PM

## 2017-04-21 NOTE — H&P (Signed)
History and Physical  Evelyn Riley BMW:[id number] DOB: 03/27/1945 DOA: 04/21/2017  Referring physician: ER physician PCP: Orlena Sheldon, PA-C  Outpatient Specialists:    Patient coming from: Home  Chief Complaint: Confusion  HPI:  Patient is a 72 year old Caucasian female with past medical history significant for recent diagnosis of congestive heart failure with EF of 20%, and metastatic breast cancer.  Apparently, the patient was discharged from the hospital recently.  The patient was found in the closet at home by the son, was brought to the hospital for further assessment and management.  On presentation to the hospital, the patient was found to have fever.  Temperature of 103.1 has been recorded.  Patient also endorsed fever or chills.  No headache, no neck pain, no chest pain, no shortness of breath, no cough, no GI symptoms and no urinary symptoms.  Workup done on presentation revealed lactic acid of 4, CBC of 15, and neutrophil count of 91%.  Patient will be admitted for further assessment and management of sepsis.  ED Course: CBC revealed WBC of 15, hemoglobin of 16.1, hematocrit of 47.7, MCV of 93.3 platelet count of 208.  Chemistry revealed sodium of 138, potassium of 3.9, chloride 104, CO2 22, BUN of 18 and creatinine of 0.7 normal blood sugar of 228. Pertinent labs: Please see above.  Chest x-ray reveals consolidation throughout right middle lobe as well as right lower lobe with right pleural effusion.  Is said to be suspicious of pneumonia.  Pulmonary vascular congestion is also reported.  Questionable degree of adenopathy was reported. EKG: Independently reviewed.  Imaging: independently reviewed.   Review of Systems:  10 systems.  Reviewed.  The patient has fever.  Otherwise, other findings as in the history of presenting illness. Negative for visual changes, sore throat, rash, new muscle aches, chest pain, SOB, dysuria, bleeding, n/v/abdominal pain.  Past Medical History:    Diagnosis Date  . Hypertension     History reviewed. No pertinent surgical history.   reports that  has never smoked. she has never used smokeless tobacco. She reports that she does not drink alcohol or use drugs.  No Known Allergies  Family History  Problem Relation Age of Onset  . Hypertension Mother   . Heart disease Father 25       died at 85     Prior to Admission medications   Medication Sig Start Date End Date Taking? Authorizing Provider  aspirin 81 MG EC tablet Take 1 tablet (81 mg total) by mouth daily. 04/08/17  Yes Dana Allan I, MD  furosemide (LASIX) 40 MG tablet Take 1 tablet (40 mg total) by mouth daily. 04/14/17  Yes Weaver, Scott T, PA-C  irbesartan (AVAPRO) 75 MG tablet Take 1 tablet (75 mg total) by mouth daily. 04/14/17  Yes Weaver, Scott T, PA-C  metoprolol succinate (TOPROL XL) 50 MG 24 hr tablet Take 1 tablet (50 mg total) by mouth daily. Take with or immediately following a meal. 04/14/17  Yes Weaver, Scott T, PA-C  potassium chloride (K-DUR,KLOR-CON) 10 MEQ tablet Take 1 tablet (10 mEq total) by mouth 2 (two) times daily. 04/14/17  Yes Weaver, Scott T, PA-C  spironolactone (ALDACTONE) 25 MG tablet Take 0.5 tablets (12.5 mg total) by mouth daily. 04/14/17  Yes Richardson Dopp T, [place]    Physical Exam: Vitals:   04/21/17 1530 04/21/17 1600 04/21/17 1630 04/21/17 1640  BP: (!) 112/56 (!) 107/57 112/62 107/63  Pulse: 85 82 82 79  Resp:  Temp:      TempSrc:      SpO2: 97% 97% 97% 97%  Weight:      Height:         Constitutional:  . Acutely ill looking.   Eyes:  . No pallor. No jaundice.  ENMT:  . external ears, nose appear normal Neck:  . Neck is supple. No JVD Respiratory:  . Crackles right lower lobe posteriorly. Cardiovascular:  . S1S2 . No LE extremity edema   Abdomen:  . Abdomen is soft and non tender. Organs are difficult to assess. Neurologic:  . Awake and alert. . Moves all limbs.  Wt Readings from Last 3 Encounters:  04/21/17  63 kg (139 lb)  04/14/17 63 kg (139 lb)  04/07/17 63.1 kg (139 lb 3.2 oz)    I have personally reviewed following labs and imaging studies  Labs on Admission:  CBC: Recent Labs  Lab 04/21/17 1227  WBC 15.0*  NEUTROABS 13.7*  HGB 16.1*  HCT 47.7*  MCV 93.3  PLT 161   Basic Metabolic Panel: Recent Labs  Lab 04/21/17 1227 04/21/17 1807  NA 138  --   K 3.9  --   CL 104  --   CO2 22  --   GLUCOSE 228*  --   BUN 18  --   CREATININE 0.79  --   CALCIUM 9.0  --   MG  --  1.5*  PHOS  --  3.0   Liver Function Tests: Recent Labs  Lab 04/21/17 1227  AST 32  ALT 24  ALKPHOS 161*  BILITOT 3.8*  PROT 7.7  ALBUMIN 3.2*   No results for input(s): LIPASE, AMYLASE in the last 168 hours. No results for input(s): AMMONIA in the last 168 hours. Coagulation Profile: Recent Labs  Lab 04/21/17 1227 04/21/17 1807  INR 1.06 1.22   Cardiac Enzymes: No results for input(s): CKTOTAL, CKMB, CKMBINDEX, TROPONINI in the last 168 hours. BNP (last 3 results) No results for input(s): PROBNP in the last 8760 hours. HbA1C: No results for input(s): HGBA1C in the last 72 hours. CBG: No results for input(s): GLUCAP in the last 168 hours. Lipid Profile: No results for input(s): CHOL, HDL, LDLCALC, TRIG, CHOLHDL, LDLDIRECT in the last 72 hours. Thyroid Function Tests: No results for input(s): TSH, T4TOTAL, FREET4, T3FREE, THYROIDAB in the last 72 hours. Anemia Panel: No results for input(s): VITAMINB12, FOLATE, FERRITIN, TIBC, IRON, RETICCTPCT in the last 72 hours. Urine analysis:    Component Value Date/Time   COLORURINE AMBER (A) 04/21/2017 1227   APPEARANCEUR CLEAR 04/21/2017 1227   LABSPEC 1.026 04/21/2017 1227   PHURINE 5.0 04/21/2017 1227   GLUCOSEU NEGATIVE 04/21/2017 1227   HGBUR SMALL (A) 04/21/2017 1227   BILIRUBINUR NEGATIVE 04/21/2017 1227   KETONESUR 5 (A) 04/21/2017 1227   PROTEINUR 100 (A) 04/21/2017 1227   NITRITE NEGATIVE 04/21/2017 1227   LEUKOCYTESUR NEGATIVE  04/21/2017 1227   Sepsis Labs: @LABRCNTIP (procalcitonin:4,lacticidven:4) )No results found for this or any previous visit (from the past 240 hour(s)).    Radiological Exams on Admission: Dg Chest 2 View  Result Date: 04/21/2017 CLINICAL DATA:  Low oxygen saturation. EXAM: CHEST - 2 VIEW COMPARISON:  04/04/2017 chest radiograph and CT. FINDINGS: Stable fullness in the right hilum. Persistent right basilar densities compatible with pleural fluid and volume loss/consolidation. Heart size remains enlarged. No significant airspace disease or consolidation in the left lung. Atherosclerotic calcifications at the aortic arch. Old right seventh rib fracture. IMPRESSION: Persistent pleural and parenchymal  densities in the right lower chest. Findings are compatible with right pleural effusion and consolidation/volume loss. Findings are similar to the previous examination. Electronically Signed   By: Markus Daft M.D.   On: 04/21/2017 13:24    EKG: Independently reviewed.   Active Problems:   Sepsis ([place])   Assessment/Plan 1. Sepsis: -Source unknown for now. -This could be from pneumonic process. -We will panculture the patient. -We will proceed with CT of the chest without contrast. -We will start patient on IV vancomycin and cefepime. Further management depend on hospital course.  Possible pneumonia: -Please see above.  Congestive heart failure with EF of 20%: -This is stable. -Will hydrate patient gently. -Currently discussed with cardiology regarding need to have a repeat echo just to assess patient's EF.  Metastatic breast cancer: -We defer to the oncology team. -Patient maintains that she would want to be DO NOT RESUSCITATE.  DVT prophylaxis: Subcu Lovenox Code Status: DO NOT RESUSCITATE Family Communication: None around Disposition Plan: Will depend on hospital course Consults called: Low threshold to consult cardiology and oncology. Admission status: Inpatient  Time spent: 60  minutes  Dana Allan, MD  [facility] Pager #: [phone] 7PM-7AM contact night coverage as above   04/21/2017, 7:31 PM

## 2017-04-21 NOTE — Progress Notes (Signed)
Patient present for admission paged admitting doc for orders. On assessment to unit patient has a DTPI to sacrum, bilateral calf scabbed over ulcers, pink boggy heels potentially stage one on heels. Heels elevated to attempt to reduce pressure and friction.

## 2017-04-21 NOTE — ED Notes (Signed)
Patient transported to X-ray 

## 2017-04-21 NOTE — Progress Notes (Signed)
Pharmacy Antibiotic Note  Evelyn Riley is a 72 y.o. female admitted on 04/21/2017 with HCAP.  Pharmacy has been consulted for vancomycin and cefepime dosing.  She was given Rocephin 1 gm at 1239  Plan: vancomycin 1500 mg loading dose followed by Vancomycin 750 mg IV q24 for est AUC 505 (using 0.85 for SCr) Cefepime 2 gm IV x 1 then cefepime 1 gm IV q8h F/u renal function, wbc, temp, culture data Vancomycin levels as needed  Height: 5\' 1"  (154.9 cm) Weight: 139 lb (63 kg) IBW/kg (Calculated) : 47.8  Temp (24hrs), Avg:101.1 F (38.4 C), Min:99.5 F (37.5 C), Max:103.1 F (39.5 C)  Recent Labs  Lab 04/21/17 1227 04/21/17 1230 04/21/17 1430  WBC 15.0*  --   --   CREATININE 0.79  --   --   LATICACIDVEN  --  4.00* 2.74*    Estimated Creatinine Clearance: 54.9 mL/min (by C-G formula based on SCr of 0.79 mg/dL).    No Known Allergies  Antimicrobials this admission: 3/8 vanc>> 3/8 Cefepime >> 3/8 CTX x 1 dose  Dose adjustments this admission:  Microbiology results: 3/8 BCx2>> 3/8 Ucx>> 3/8 MRSA PCR>> 3/8 sputum >>   Thank you for allowing pharmacy to be a part of this patient's care.  Eudelia Bunch, Pharm.D. [phone] 04/21/2017 5:45 PM

## 2017-04-21 NOTE — ED Notes (Signed)
Bed: WA02 Expected date:  Expected time:  Means of arrival:  Comments: [id number]-WEAK/uti

## 2017-04-21 NOTE — ED Provider Notes (Signed)
[facility] DEPT Provider Note   CSN: [id number] Arrival date & time: 04/21/17  1142     History   Chief Complaint No chief complaint on file.   HPI Evelyn Riley is a 72 y.o. female.  The history is provided by the patient and a relative. No language interpreter was used.    Evelyn Riley is a 72 y.o. female who presents to the Emergency Department complaining of weakness.   She presents accompanied by her son for evaluation of weakness.  She was last seen last night and she was noted to have vomiting and shaking chills at that time.  This morning when he came to check on her she was found sleeping in the closet.  She has experienced ongoing vomiting.  She has no known fever at home.  She denies any chest pain, shortness of breath, abdominal pain, diarrhea, dysuria.  She is not sure why she was in the closet this morning.  She does not recall any falls.  About a month ago she was diagnosed with metastatic breast cancer as well as CHF.  She was discharged home 3 weeks ago.  She states that since hospital discharge her edema has been significantly improved.  She has only followed up with cardiology.  Past Medical History:  Diagnosis Date  . Hypertension     Patient Active Problem List   Diagnosis Date Noted  . Sepsis ([place]) 04/21/2017  . Dilated cardiomyopathy ([place]) 04/14/2017  . Mass of right breast   . Goals of care, counseling/discussion   . Palliative care encounter   . Acute on chronic combined systolic and diastolic CHF (congestive heart failure) ([place])   . Pressure injury of skin 04/05/2017  . Acute CHF (congestive heart failure) ([place]) 04/04/2017  . Hypertension     History reviewed. No pertinent surgical history.  OB History    No data available       Home Medications    Prior to Admission medications   Medication Sig Start Date End Date Taking? Authorizing Provider  aspirin 81 MG EC tablet Take 1 tablet (81 mg total) by mouth daily.  04/08/17  Yes Dana Allan I, MD  furosemide (LASIX) 40 MG tablet Take 1 tablet (40 mg total) by mouth daily. 04/14/17  Yes Weaver, Scott T, PA-C  irbesartan (AVAPRO) 75 MG tablet Take 1 tablet (75 mg total) by mouth daily. 04/14/17  Yes Weaver, Scott T, PA-C  metoprolol succinate (TOPROL XL) 50 MG 24 hr tablet Take 1 tablet (50 mg total) by mouth daily. Take with or immediately following a meal. 04/14/17  Yes Weaver, Scott T, PA-C  potassium chloride (K-DUR,KLOR-CON) 10 MEQ tablet Take 1 tablet (10 mEq total) by mouth 2 (two) times daily. 04/14/17  Yes Weaver, Scott T, PA-C  spironolactone (ALDACTONE) 25 MG tablet Take 0.5 tablets (12.5 mg total) by mouth daily. 04/14/17  Yes Liliane Shi, PA-C    Family History Family History  Problem Relation Age of Onset  . Hypertension Mother   . Heart disease Father 70       died at 15    Social History Social History   Tobacco Use  . Smoking status: Never Smoker  . Smokeless tobacco: Never Used  Substance Use Topics  . Alcohol use: No  . Drug use: No     Allergies   Patient has no known allergies.   Review of Systems Review of Systems  All other systems reviewed and are negative.  Physical Exam Updated Vital Signs BP 112/62   Pulse 82   Temp 99.5 F (37.5 C) (Oral)   Resp 20   Ht 5\' 1"  (1.549 m)   Wt 63 kg (139 lb)   SpO2 97%   BMI 26.26 kg/m   Physical Exam  Constitutional: She is oriented to person, place, and time. She appears well-developed and well-nourished.  HENT:  Head: Normocephalic and atraumatic.  Dry mucous membranes  Cardiovascular: Regular rhythm.  No murmur heard. tachycardic  Pulmonary/Chest: Effort normal. No respiratory distress.  Crackles in left lung base, decreased air movement in right lung base  Abdominal: Soft. There is no rebound and no guarding.  Mild generalized abdominal tenderness  Musculoskeletal: She exhibits no edema or tenderness.  Neurological: She is alert and oriented to person,  place, and time.  Generalized weakness  Skin: Skin is warm and dry.  Psychiatric: She has a normal mood and affect. Her behavior is normal.  Nursing note and vitals reviewed.    ED Treatments / Results  Labs (all labs ordered are listed, but only abnormal results are displayed) Labs Reviewed  COMPREHENSIVE METABOLIC PANEL - Abnormal; Notable for the following components:      Result Value   Glucose, Bld 228 (*)    Albumin 3.2 (*)    Alkaline Phosphatase 161 (*)    Total Bilirubin 3.8 (*)    All other components within normal limits  CBC WITH DIFFERENTIAL/PLATELET - Abnormal; Notable for the following components:   WBC 15.0 (*)    Hemoglobin 16.1 (*)    HCT 47.7 (*)    Neutro Abs 13.7 (*)    Lymphs Abs 0.6 (*)    All other components within normal limits  URINALYSIS, ROUTINE W REFLEX MICROSCOPIC - Abnormal; Notable for the following components:   Color, Urine AMBER (*)    Hgb urine dipstick SMALL (*)    Ketones, ur 5 (*)    Protein, ur 100 (*)    Bacteria, UA RARE (*)    Squamous Epithelial / LPF 0-5 (*)    All other components within normal limits  I-STAT CG4 LACTIC ACID, ED - Abnormal; Notable for the following components:   Lactic Acid, Venous 4.00 (*)    All other components within normal limits  I-STAT CG4 LACTIC ACID, ED - Abnormal; Notable for the following components:   Lactic Acid, Venous 2.74 (*)    All other components within normal limits  CULTURE, BLOOD (ROUTINE X 2)  CULTURE, BLOOD (ROUTINE X 2)  URINE CULTURE  PROTIME-INR    EKG  EKG Interpretation None       Radiology Dg Chest 2 View  Result Date: 04/21/2017 CLINICAL DATA:  Low oxygen saturation. EXAM: CHEST - 2 VIEW COMPARISON:  04/04/2017 chest radiograph and CT. FINDINGS: Stable fullness in the right hilum. Persistent right basilar densities compatible with pleural fluid and volume loss/consolidation. Heart size remains enlarged. No significant airspace disease or consolidation in the left  lung. Atherosclerotic calcifications at the aortic arch. Old right seventh rib fracture. IMPRESSION: Persistent pleural and parenchymal densities in the right lower chest. Findings are compatible with right pleural effusion and consolidation/volume loss. Findings are similar to the previous examination. Electronically Signed   By: Markus Daft M.D.   On: 04/21/2017 13:24    Procedures Procedures (including critical care time) CRITICAL CARE Performed by: Quintella Reichert   Total critical care time: 35 minutes  Critical care time was exclusive of separately billable procedures and treating other  patients.  Critical care was necessary to treat or prevent imminent or life-threatening deterioration.  Critical care was time spent personally by me on the following activities: development of treatment plan with patient and/or surrogate as well as nursing, discussions with consultants, evaluation of patient's response to treatment, examination of patient, obtaining history from patient or surrogate, ordering and performing treatments and interventions, ordering and review of laboratory studies, ordering and review of radiographic studies, pulse oximetry and re-evaluation of patient's condition.  Medications Ordered in ED Medications  acetaminophen (TYLENOL) tablet 650 mg (650 mg Oral Given 04/21/17 1245)  cefTRIAXone (ROCEPHIN) 1 g in sodium chloride 0.9 % 100 mL IVPB (0 g Intravenous Stopped 04/21/17 1335)  sodium chloride 0.9 % bolus 500 mL (0 mLs Intravenous Stopped 04/21/17 1633)  sodium chloride 0.9 % bolus 500 mL (0 mLs Intravenous Stopped 04/21/17 1425)     Initial Impression / Assessment and Plan / ED Course  I have reviewed the triage vital signs and the nursing notes.  Pertinent labs & imaging results that were available during my care of the patient were reviewed by me and considered in my medical decision making (see chart for details).     Pt with recent diagnosis of metastatic breast cancer  as well as congestive heart failure here for evaluation of weakness, vomiting since last night.  She is dehydrated and ill-appearing on examination.  There is a malodorous urine smell in the room.  Concern for UTI based on her symptoms and presentation.  She was treated with IV fluids and IV antibiotics for presumed sepsis given her fever.  She did not receive a full 30 cc/kg bolus given her recent diagnosis of severe CHF with EF of 20%.  She was provided gentle IV fluid hydration and did tolerate one half thousand mL bolus.  She did not have any hypotension in the department.  Hospitalist consulted for admission for further treatment.  Final Clinical Impressions(s) / ED Diagnoses   Final diagnoses:  None    ED Discharge Orders    None       Quintella Reichert, MD 04/21/17 [phone]

## 2017-04-21 NOTE — ED Notes (Addendum)
Not giving traditional 40ml/kg fluid resuscitation due to severe CHF hx and wet lung sounds, per MD Rees's order. Bolusing pt at 521ml/hr to avoid fluid overload.

## 2017-04-22 ENCOUNTER — Inpatient Hospital Stay (HOSPITAL_COMMUNITY): Payer: Medicare Other

## 2017-04-22 DIAGNOSIS — J181 Lobar pneumonia, unspecified organism: Secondary | ICD-10-CM

## 2017-04-22 DIAGNOSIS — R7881 Bacteremia: Secondary | ICD-10-CM

## 2017-04-22 DIAGNOSIS — C50911 Malignant neoplasm of unspecified site of right female breast: Secondary | ICD-10-CM

## 2017-04-22 DIAGNOSIS — A400 Sepsis due to streptococcus, group A: Secondary | ICD-10-CM

## 2017-04-22 LAB — CBC
HCT: 41.2 % (ref 36.0–46.0)
Hemoglobin: 13.7 g/dL (ref 12.0–15.0)
MCH: 30.9 pg (ref 26.0–34.0)
MCHC: 33.3 g/dL (ref 30.0–36.0)
MCV: 93 fL (ref 78.0–100.0)
Platelets: 172 10*3/uL (ref 150–400)
RBC: 4.43 MIL/uL (ref 3.87–5.11)
RDW: 15.5 % (ref 11.5–15.5)
WBC: 9.7 10*3/uL (ref 4.0–10.5)

## 2017-04-22 LAB — BLOOD CULTURE ID PANEL (REFLEXED)
Acinetobacter baumannii: NOT DETECTED
CANDIDA KRUSEI: NOT DETECTED
CANDIDA PARAPSILOSIS: NOT DETECTED
Candida albicans: NOT DETECTED
Candida glabrata: NOT DETECTED
Candida tropicalis: NOT DETECTED
ESCHERICHIA COLI: NOT DETECTED
Enterobacter cloacae complex: NOT DETECTED
Enterobacteriaceae species: NOT DETECTED
Enterococcus species: NOT DETECTED
Haemophilus influenzae: NOT DETECTED
KLEBSIELLA OXYTOCA: NOT DETECTED
Klebsiella pneumoniae: NOT DETECTED
LISTERIA MONOCYTOGENES: NOT DETECTED
Neisseria meningitidis: NOT DETECTED
PROTEUS SPECIES: NOT DETECTED
Pseudomonas aeruginosa: NOT DETECTED
SERRATIA MARCESCENS: NOT DETECTED
STAPHYLOCOCCUS AUREUS BCID: NOT DETECTED
STAPHYLOCOCCUS SPECIES: NOT DETECTED
STREPTOCOCCUS PYOGENES: DETECTED — AB
Streptococcus agalactiae: NOT DETECTED
Streptococcus pneumoniae: NOT DETECTED
Streptococcus species: DETECTED — AB

## 2017-04-22 LAB — URINE CULTURE: CULTURE: NO GROWTH

## 2017-04-22 LAB — COMPREHENSIVE METABOLIC PANEL
ALT: 20 U/L (ref 14–54)
AST: 26 U/L (ref 15–41)
Albumin: 2.7 g/dL — ABNORMAL LOW (ref 3.5–5.0)
Alkaline Phosphatase: 108 U/L (ref 38–126)
Anion gap: 9 (ref 5–15)
BUN: 18 mg/dL (ref 6–20)
CO2: 21 mmol/L — ABNORMAL LOW (ref 22–32)
Calcium: 8.4 mg/dL — ABNORMAL LOW (ref 8.9–10.3)
Chloride: 108 mmol/L (ref 101–111)
Creatinine, Ser: 0.59 mg/dL (ref 0.44–1.00)
GFR calc Af Amer: >60 mL/min (ref >60)
GFR calc non Af Amer: >60 mL/min (ref >60)
Glucose, Bld: 140 mg/dL — ABNORMAL HIGH (ref 65–99)
Potassium: 3.4 mmol/L — ABNORMAL LOW (ref 3.5–5.1)
Sodium: 138 mmol/L (ref 135–145)
Total Bilirubin: 2.3 mg/dL — ABNORMAL HIGH (ref 0.3–1.2)
Total Protein: 6 g/dL — ABNORMAL LOW (ref 6.5–8.1)

## 2017-04-22 MED ORDER — POTASSIUM CHLORIDE CRYS ER 20 MEQ PO TBCR
40.0000 meq | EXTENDED_RELEASE_TABLET | ORAL | Status: AC
  Administered 2017-04-22 (×2): 40 meq via ORAL
  Filled 2017-04-22 (×2): qty 2

## 2017-04-22 MED ORDER — PENICILLIN G POTASSIUM 5000000 UNITS IJ SOLR
4.0000 10*6.[IU] | INTRAMUSCULAR | Status: DC
  Administered 2017-04-22 – 2017-04-23 (×7): 4 10*6.[IU] via INTRAVENOUS
  Filled 2017-04-22 (×8): qty 4

## 2017-04-22 MED ORDER — MAGNESIUM SULFATE 2 GM/50ML IV SOLN
2.0000 g | Freq: Once | INTRAVENOUS | Status: AC
  Administered 2017-04-22: 2 g via INTRAVENOUS
  Filled 2017-04-22: qty 50

## 2017-04-22 NOTE — Progress Notes (Addendum)
PROGRESS NOTE [facility]   Evelyn Riley   SHF:[id number] DOB: 01-Aug-1945  DOA: 04/21/2017 PCP: Orlena Sheldon, PA-C   Brief Narrative:  Evelyn Riley is a 72 year old female with medical history significant for systolic congestive heart failure, and metastatic breast cancer who presented to the emergency department after son found her sitting in the closet.  Son reported that she was confused and brought her to the hospital for further evaluation.  She was recently discharged from the hospital after being admitted for new systolic CHF diagnosis with EF of 20%.  Upon ED evaluation she was found to be febrile with temperature of 103.1, lactic acid of 4, elevated white count 15.  An x-ray concerning for consolidation on the right lower lobe with pleural effusion.  Patient was admitted with working diagnosis of sepsis secondary to pneumonia.  Blood cultures revealed strep pyogenes 2/2 bottles.  Patient been treated with penicillin G  Subjective: Patient seen and examined, she has no complaints.  Does not remember what happened yesterday.  Patient's son report that she is mentally back to baseline.  Patient reported she had sore throat about 5 days prior to admission that resolved on her own, patient's granddaughter was diagnosed with strep throat about 3 days ago.  Patient remains afebrile, vital signs stable.  Assessment & Plan: Sepsis secondary to bacteremic strep pyogenes, felt to be due to right lower lobe pneumonia. Chest x-ray shows consolidation in the right lower lobe with right pleural effusion, although pleural effusion could be reactive to breast cancer, pro-calcitonin is elevated with elevated white count, crackles on exam and fever presentation, therefore we will treat this as pneumonia.  Patient was initially started on IV vancomycin and cefepime, switched to penicillin G 4,000,000 units every 4.  We will continue penicillin for 24 hours and if patient continues to be stable can switch to  oral antibiotics and continue treatment at home.  No need for further workup.  Right lower lobe pneumonia See above  Metastatic breast cancer CT of the chest shows findings consistent with right breast cancer with metastasis to the right axillary and retropectoral nodes.  There is also bony metastatic disease.  I have discussed this with the patient and she will follow-up with oncology.  Chronic combined systolic and diastolic heart failure No signs of fluid overload, will continue Aldactone, beta-blocker and ARB. Continue to monitor closely.  Hypokalemia/hypomagnesemia Replete Check labs in a.m.  DVT prophylaxis: Lovenox Code Status: DNR Family Communication: Son at bedside Disposition Plan: Home in the next 24-48 hours, if patient continues to improve and afebrile  Consultants:   None  Procedures:   None  Antimicrobials: Anti-infectives (From admission, onward)   Start     Dose/Rate Route Frequency Ordered Stop   04/22/17 1800  vancomycin (VANCOCIN) IVPB 750 mg/150 ml premix  Status:  Discontinued     750 mg 150 mL/hr over 60 Minutes Intravenous Every 24 hours 04/21/17 1829 04/22/17 0537   04/22/17 0800  penicillin G potassium 4 Million Units in dextrose 5 % 250 mL IVPB     4 Million Units 250 mL/hr over 60 Minutes Intravenous Every 4 hours 04/22/17 0538     04/22/17 0200  ceFEPIme (MAXIPIME) 1 g in sodium chloride 0.9 % 100 mL IVPB  Status:  Discontinued     1 g 200 mL/hr over 30 Minutes Intravenous Every 8 hours 04/21/17 1747 04/22/17 0537   04/21/17 1830  vancomycin (VANCOCIN) 1,500 mg in sodium chloride 0.9 % 500 mL IVPB  1,500 mg 250 mL/hr over 120 Minutes Intravenous  Once 04/21/17 1730 04/21/17 2002   04/21/17 1800  ceFEPIme (MAXIPIME) 2 g in sodium chloride 0.9 % 100 mL IVPB     2 g 200 mL/hr over 30 Minutes Intravenous  Once 04/21/17 1727 04/21/17 1829   04/21/17 1730  vancomycin (VANCOCIN) IVPB 1000 mg/200 mL premix  Status:  Discontinued     1,000  mg 200 mL/hr over 60 Minutes Intravenous  Once 04/21/17 1727 04/21/17 1855   04/21/17 1230  cefTRIAXone (ROCEPHIN) 1 g in sodium chloride 0.9 % 100 mL IVPB     1 g 200 mL/hr over 30 Minutes Intravenous  Once 04/21/17 1219 04/21/17 1335        Objective: Vitals:   04/22/17 0400 04/22/17 0410 04/22/17 0800 04/22/17 0805  BP: 129/73  (!) 142/82   Pulse: 72  70   Resp: (!) 28  (!) 24   Temp:  97.7 F (36.5 C)  98.5 F (36.9 C)  TempSrc:  Oral  Oral  SpO2: 94%  95%   Weight:  61.1 kg (134 lb 11.2 oz)    Height:        Intake/Output Summary (Last 24 hours) at 04/22/2017 0920 Last data filed at 04/22/2017 0900 Gross per 24 hour  Intake 2536.25 ml  Output -  Net 2536.25 ml   Filed Weights   04/21/17 1202 04/22/17 0410  Weight: 63 kg (139 lb) 61.1 kg (134 lb 11.2 oz)    Examination:  General exam: Appears calm and comfortable  HEENT: OP erythematous, no exudates no LAD. Respiratory system: Decreased breath sounds on the right side, crackles right lower lobe. Cardiovascular system: S1 & S2 heard, RRR. No JVD, murmurs, rubs or gallops Gastrointestinal system: Abdomen is nondistended, soft and nontender.  Central nervous system: Alert and oriented. No focal neurological deficits. Extremities: No pedal edema. Skin: No rashes Psychiatry: Judgement and insight appear normal. Mood & affect appropriate.    Data Reviewed: I have personally reviewed following labs and imaging studies  CBC: Recent Labs  Lab 04/21/17 1227 04/22/17 0336  WBC 15.0* 9.7  NEUTROABS 13.7*  --   HGB 16.1* 13.7  HCT 47.7* 41.2  MCV 93.3 93.0  PLT 208 568   Basic Metabolic Panel: Recent Labs  Lab 04/21/17 1227 04/21/17 1807 04/22/17 0336  NA 138  --  138  K 3.9  --  3.4*  CL 104  --  108  CO2 22  --  21*  GLUCOSE 228*  --  140*  BUN 18  --  18  CREATININE 0.79  --  0.59  CALCIUM 9.0  --  8.4*  MG  --  1.5*  --   PHOS  --  3.0  --    GFR: Estimated Creatinine Clearance: 54.1 mL/min (by  C-G formula based on SCr of 0.59 mg/dL). Liver Function Tests: Recent Labs  Lab 04/21/17 1227 04/22/17 0336  AST 32 26  ALT 24 20  ALKPHOS 161* 108  BILITOT 3.8* 2.3*  PROT 7.7 6.0*  ALBUMIN 3.2* 2.7*   No results for input(s): LIPASE, AMYLASE in the last 168 hours. No results for input(s): AMMONIA in the last 168 hours. Coagulation Profile: Recent Labs  Lab 04/21/17 1227 04/21/17 1807  INR 1.06 1.22   Cardiac Enzymes: No results for input(s): CKTOTAL, CKMB, CKMBINDEX, TROPONINI in the last 168 hours. BNP (last 3 results) No results for input(s): PROBNP in the last 8760 hours. HbA1C: No results for input(s):  HGBA1C in the last 72 hours. CBG: No results for input(s): GLUCAP in the last 168 hours. Lipid Profile: No results for input(s): CHOL, HDL, LDLCALC, TRIG, CHOLHDL, LDLDIRECT in the last 72 hours. Thyroid Function Tests: No results for input(s): TSH, T4TOTAL, FREET4, T3FREE, THYROIDAB in the last 72 hours. Anemia Panel: No results for input(s): VITAMINB12, FOLATE, FERRITIN, TIBC, IRON, RETICCTPCT in the last 72 hours. Sepsis Labs: Recent Labs  Lab 04/21/17 1230 04/21/17 1430 04/21/17 1807 04/21/17 2051  PROCALCITON  --   --  5.24  --   LATICACIDVEN 4.00* 2.74* 2.4* 3.2*    Recent Results (from the past 240 hour(s))  Culture, blood (Routine x 2)     Status: None (Preliminary result)   Collection Time: 04/21/17 12:27 PM  Result Value Ref Range Status   Specimen Description BLOOD RIGHT FOREARM  Final   Special Requests   Final    BOTTLES DRAWN AEROBIC AND ANAEROBIC Blood Culture adequate volume Performed at [facility] [address] [place], [place] [address]    Culture  Setup Time   Final    GRAM POSITIVE COCCI IN BOTH AEROBIC AND ANAEROBIC BOTTLES Organism ID to follow CRITICAL RESULT CALLED TO, READ BACK BY AND VERIFIED WITH: Guadlupe Spanish [facility] 04/22/17 3154 JDW    Culture PENDING  Incomplete   Report Status PENDING  Incomplete    Blood Culture ID Panel (Reflexed)     Status: Abnormal   Collection Time: 04/21/17 12:27 PM  Result Value Ref Range Status   Enterococcus species NOT DETECTED NOT DETECTED Final   Listeria monocytogenes NOT DETECTED NOT DETECTED Final   Staphylococcus species NOT DETECTED NOT DETECTED Final   Staphylococcus aureus NOT DETECTED NOT DETECTED Final   Streptococcus species DETECTED (A) NOT DETECTED Final    Comment: CRITICAL RESULT CALLED TO, READ BACK BY AND VERIFIED WITH: N GLOGOVAC PHARMD 04/22/17 0527 JDW    Streptococcus agalactiae NOT DETECTED NOT DETECTED Final   Streptococcus pneumoniae NOT DETECTED NOT DETECTED Final   Streptococcus pyogenes DETECTED (A) NOT DETECTED Final    Comment: CRITICAL RESULT CALLED TO, READ BACK BY AND VERIFIED WITH: N GLOGOVAC PHARMD 04/22/17 0527 JDW    Acinetobacter baumannii NOT DETECTED NOT DETECTED Final   Enterobacteriaceae species NOT DETECTED NOT DETECTED Final   Enterobacter cloacae complex NOT DETECTED NOT DETECTED Final   Escherichia coli NOT DETECTED NOT DETECTED Final   Klebsiella oxytoca NOT DETECTED NOT DETECTED Final   Klebsiella pneumoniae NOT DETECTED NOT DETECTED Final   Proteus species NOT DETECTED NOT DETECTED Final   Serratia marcescens NOT DETECTED NOT DETECTED Final   Haemophilus influenzae NOT DETECTED NOT DETECTED Final   Neisseria meningitidis NOT DETECTED NOT DETECTED Final   Pseudomonas aeruginosa NOT DETECTED NOT DETECTED Final   Candida albicans NOT DETECTED NOT DETECTED Final   Candida glabrata NOT DETECTED NOT DETECTED Final   Candida krusei NOT DETECTED NOT DETECTED Final   Candida parapsilosis NOT DETECTED NOT DETECTED Final   Candida tropicalis NOT DETECTED NOT DETECTED Final  Culture, blood (Routine x 2)     Status: None (Preliminary result)   Collection Time: 04/21/17 12:37 PM  Result Value Ref Range Status   Specimen Description BLOOD RIGHT HAND  Final   Special Requests   Final    BOTTLES DRAWN AEROBIC AND  ANAEROBIC Blood Culture results may not be optimal due to an inadequate volume of blood received in culture bottles Performed at [facility], [address]  [place], [place] [address]    Culture  Setup Time   Final    GRAM POSITIVE COCCI IN BOTH AEROBIC AND ANAEROBIC BOTTLES CRITICAL VALUE NOTED.  VALUE IS CONSISTENT WITH PREVIOUSLY REPORTED AND CALLED VALUE.    Culture PENDING  Incomplete   Report Status PENDING  Incomplete  MRSA PCR Screening     Status: None   Collection Time: 04/21/17  5:43 PM  Result Value Ref Range Status   MRSA by PCR NEGATIVE NEGATIVE Final    Comment:        The GeneXpert MRSA Assay (FDA approved for NASAL specimens only), is one component of a comprehensive MRSA colonization surveillance program. It is not intended to diagnose MRSA infection nor to guide or monitor treatment for MRSA infections. Performed at [facility], [place] [address], [place], [place] [address]       Radiology Studies: Dg Chest 2 View  Result Date: 04/21/2017 CLINICAL DATA:  Low oxygen saturation. EXAM: CHEST - 2 VIEW COMPARISON:  04/04/2017 chest radiograph and CT. FINDINGS: Stable fullness in the right hilum. Persistent right basilar densities compatible with pleural fluid and volume loss/consolidation. Heart size remains enlarged. No significant airspace disease or consolidation in the left lung. Atherosclerotic calcifications at the aortic arch. Old right seventh rib fracture. IMPRESSION: Persistent pleural and parenchymal densities in the right lower chest. Findings are compatible with right pleural effusion and consolidation/volume loss. Findings are similar to the previous examination. Electronically Signed   By: Markus Daft M.D.   On: 04/21/2017 13:24   Ct Chest Wo Contrast  Result Date: 04/22/2017 CLINICAL DATA:  Pneumonia. EXAM: CT CHEST WITHOUT CONTRAST TECHNIQUE: Multidetector CT imaging of the chest was performed following the standard  protocol without IV contrast. COMPARISON:  Chest CT April 04, 2017. FINDINGS: Cardiovascular: Cardiomegaly. Coronary artery calcifications. The thoracic aorta is normal in caliber but atherosclerotic. The central pulmonary arteries are normal in caliber. Mediastinum/Nodes: A large right breast mass is again identified measuring 5.9 by 3.2 cm today versus 5.6 x 2.9 cm previously. Multiple axillary and retropectoral nodes are identified. A prominent right axillary node measures 3.5 by 2.2 cm, unchanged. A few of the nodes are mildly more prominent the interval. The pretracheal nodes seen previously is less well defined today and measures approximately 13 mm in AP dimension today, similar in the interval. No other enlarged nodes within the mediastinum. Moderate right pleural effusion. Tiny left pleural effusion. No pericardial effusion. Diffuse subcutaneous edema. Lungs/Pleura: Central airways are normal. No pneumothorax. Moderate right-sided pleural effusion with underlying atelectasis, similar in the interval. Mild opacities in the right middle lobe and left base are favored to represent atelectasis with infectious etiologies considered less likely. No pulmonary nodules or masses. Upper Abdomen: No acute abnormality. Musculoskeletal: Bony metastatic disease is similar in the interval primarily involving the thoracic spine. IMPRESSION: 1. Findings consistent with right breast cancer metastatic to the right axillary and retropectoral nodes. There is a single mildly prominent node in the right pretracheal region which is indeterminate. Bony metastatic disease. 2. There is a moderate right pleural effusion with underlying atelectasis, unchanged. 3. Mild opacity in the right middle lobe and left lower lobe are most consistent with atelectasis. 4. Coronary artery calcifications.  Thoracic aortic atherosclerosis. Aortic Atherosclerosis (ICD10-I70.0). Electronically Signed   By: Dorise Bullion III M.D   On: 04/22/2017  09:12      Scheduled Meds: . aspirin EC  81 mg Oral Daily  . enoxaparin (LOVENOX) injection  40 mg  Subcutaneous Q24H  . irbesartan  75 mg Oral Daily  . metoprolol succinate  50 mg Oral Q breakfast  . spironolactone  12.5 mg Oral Daily   Continuous Infusions: . pencillin G potassium IV 4 Million Units (04/22/17 0829)     LOS: 1 day    Time spent: Total of 35 minutes spent with pt, greater than 50% of which was spent in discussion of  treatment, counseling and coordination of care   Chipper Oman, MD Pager: Text Page via [email or web address]   If 7PM-7AM, please contact night-coverage [email or web address] 04/22/2017, 9:20 AM   Note - This record has been created using [employer]. Chart creation errors have been sought, but may not always have been located. Such creation errors do not reflect on the standard of medical care.

## 2017-04-22 NOTE — Progress Notes (Signed)
PHARMACY - PHYSICIAN COMMUNICATION CRITICAL VALUE ALERT - BLOOD CULTURE IDENTIFICATION (BCID)  Evelyn Riley is an 72 y.o. female who presented to [facility] on 04/21/2017 with a chief complaint of sepsis  Assessment:  sepsis  Name of physician (or Provider) Contacted: Xenia Blunt NP  Current antibiotics: vancomycin and cefepime  Changes to prescribed antibiotics recommended:  Recommendations accepted by provider   Change to penicillin G 4 million units IV q4h   Results for orders placed or performed during the hospital encounter of 04/21/17  Blood Culture ID Panel (Reflexed) (Collected: 04/21/2017 12:27 PM)  Result Value Ref Range   Enterococcus species NOT DETECTED NOT DETECTED   Listeria monocytogenes NOT DETECTED NOT DETECTED   Staphylococcus species NOT DETECTED NOT DETECTED   Staphylococcus aureus NOT DETECTED NOT DETECTED   Streptococcus species DETECTED (A) NOT DETECTED   Streptococcus agalactiae NOT DETECTED NOT DETECTED   Streptococcus pneumoniae NOT DETECTED NOT DETECTED   Streptococcus pyogenes DETECTED (A) NOT DETECTED   Acinetobacter baumannii NOT DETECTED NOT DETECTED   Enterobacteriaceae species NOT DETECTED NOT DETECTED   Enterobacter cloacae complex NOT DETECTED NOT DETECTED   Escherichia coli NOT DETECTED NOT DETECTED   Klebsiella oxytoca NOT DETECTED NOT DETECTED   Klebsiella pneumoniae NOT DETECTED NOT DETECTED   Proteus species NOT DETECTED NOT DETECTED   Serratia marcescens NOT DETECTED NOT DETECTED   Haemophilus influenzae NOT DETECTED NOT DETECTED   Neisseria meningitidis NOT DETECTED NOT DETECTED   Pseudomonas aeruginosa NOT DETECTED NOT DETECTED   Candida albicans NOT DETECTED NOT DETECTED   Candida glabrata NOT DETECTED NOT DETECTED   Candida krusei NOT DETECTED NOT DETECTED   Candida parapsilosis NOT DETECTED NOT DETECTED   Candida tropicalis NOT DETECTED NOT DETECTED    Royetta Asal, PharmD, BCPS Pager [phone] 04/22/2017 5:41 AM

## 2017-04-23 DIAGNOSIS — G9341 Metabolic encephalopathy: Secondary | ICD-10-CM

## 2017-04-23 LAB — BASIC METABOLIC PANEL
Anion gap: 9 (ref 5–15)
BUN: 16 mg/dL (ref 6–20)
CHLORIDE: 106 mmol/L (ref 101–111)
CO2: 20 mmol/L — AB (ref 22–32)
CREATININE: 0.74 mg/dL (ref 0.44–1.00)
Calcium: 8.8 mg/dL — ABNORMAL LOW (ref 8.9–10.3)
GFR calc Af Amer: >60 mL/min (ref >60)
GFR calc non Af Amer: >60 mL/min (ref >60)
Glucose, Bld: 169 mg/dL — ABNORMAL HIGH (ref 65–99)
POTASSIUM: 4.4 mmol/L (ref 3.5–5.1)
Sodium: 135 mmol/L (ref 135–145)

## 2017-04-23 LAB — MAGNESIUM: Magnesium: 2.1 mg/dL (ref 1.7–2.4)

## 2017-04-23 MED ORDER — AMOXICILLIN-POT CLAVULANATE 875-125 MG PO TABS: 1.0000 | ORAL_TABLET | Freq: Two times a day (BID) | ORAL | 0 refills | Status: AC

## 2017-04-23 NOTE — Care Management Note (Signed)
Case Management Note  Patient Details  Name: Evelyn Riley MRN: [id number] Date of Birth: 01-25-1946  Subjective/Objective:      Sepsis, PNA, Acute metabolic encephalopathy              Action/Plan: Received call from [facility] and pt active with [facility] RN. Message to attending for [facility] order for resumption of care.   Expected Discharge Date:  04/23/17               Expected Discharge Plan:  [place]  In-House Referral:  NA  Discharge planning Services  CM Consult  Post Acute Care Choice:  Home Health, Resumption of Svcs/PTA Provider Choice offered to:  Patient  DME Arranged:    DME Agency:     HH Arranged:  RN [place] Agency:  [place]  Status of Service:  Completed, signed off  If discussed at [place] of Stay Meetings, dates discussed:    Additional Comments:  Erenest Rasher, RN 04/23/2017, 11:39 AM

## 2017-04-23 NOTE — Progress Notes (Signed)
Discharge instructions explained to pt and her 2 sons. Questions answered. Discharged via wheelchair. Prescription called in.

## 2017-04-23 NOTE — Discharge Summary (Signed)
Physician Discharge Summary  Evelyn Riley  EHU:[id number]  DOB: [birth year]  DOA: 04/21/2017 PCP: Orlena Sheldon, PA-C  Admit date: 04/21/2017 Discharge date: 04/23/2017  Admitted From: Home Disposition: Home  Recommendations for Outpatient Follow-up:  1. Follow up with PCP in 1-2 weeks 2. Follow-up chest x-ray in 4-6-week to assure resolution of x-ray findings 3. Please obtain BMP/CBC in 1-2-week to monitor renal function and hemoglobin 4. Please follow up on the following pending results:  Discharge Condition: Stable CODE STATUS: Full code Diet recommendation: Heart Healthy   Brief/Interim Summary: For full details see H&P/Progress note, but in brief, Evelyn Riley is a 72 year old female with medical history significant for systolic congestive heart failure, and metastatic breast cancer who presented to the emergency department after son found her sitting in the closet.  Son reported that she was confused and brought her to the hospital for further evaluation.  She was recently discharged from the hospital after being admitted for new systolic CHF diagnosis with EF of 20%.  Upon ED evaluation she was found to be febrile with temperature of 103.1, lactic acid of 4, elevated white count 15.  An x-ray concerning for consolidation on the right lower lobe with pleural effusion.  Patient was admitted with working diagnosis of sepsis secondary to pneumonia.  Blood cultures revealed strep pyogenes 2/2 bottles.  Patient was treated with penicillin G.  Patient remained afebrile, wbc normal and she is asymptomatic.  Her mentation came back to baseline.  Patient was deemed stable  for discharge and follow-up as an outpatient.  Subjective: Patient seen and examined, she has no complaints.  Denies chest pain, shortness of breath and cough.  Wants to go home.  Discharge Diagnoses/Hospital Course:  Sepsis secondary to bacteremic strep pyogenes, felt to be due to right lower lobe pneumonia. Chest x-ray shows  consolidation in the right lower lobe with right pleural effusion, although pleural effusion could be reactive to breast cancer, pro-calcitonin is elevated with elevated white count, crackles on exam and fever presentation, therefore we will treat this as pneumonia.  Patient was initially started on IV vancomycin and cefepime, switched to penicillin G 4,000,000 units every 4.  Patient was treated with IV antibiotic for 40 hours, will switch to Augmentin to complete total of 7 days of antibiotic therapy. Follow-up with PCP in 1 week. No need further workup for bacteremia  Acute metabolic encephalopathy due to bacteremia - resolved Treating underlying causes  Right lower lobe pneumonia - strep pyogenes See above Follow-up chest x-ray in 4-6-week  Metastatic breast cancer CT of the chest shows findings consistent with right breast cancer with metastasis to the right axillary and retropectoral nodes.  There is also bony metastatic disease.  I have discussed this with the patient and she will follow-up with oncology.  Chronic combined systolic and diastolic heart failure Signs of fluid overload during hospital stay, continue home medications with no changes. Continue to monitor closely.  Hypokalemia/hypomagnesemia - resolved Follow-up lab workup in 1-2 weeks  All other chronic medical condition were stable during the hospitalization.  On the day of the discharge the patient's vitals were stable, and no other acute medical condition were reported by patient. the patient was felt safe to be discharge to home  Discharge Instructions  You were cared for by a hospitalist during your hospital stay. If you have any questions about your discharge medications or the care you received while you were in the hospital after you are discharged, you can call the unit  and asked to speak with the hospitalist on call if the hospitalist that took care of you is not available. Once you are discharged, your  primary care physician will handle any further medical issues. Please note that NO REFILLS for any discharge medications will be authorized once you are discharged, as it is imperative that you return to your primary care physician (or establish a relationship with a primary care physician if you do not have one) for your aftercare needs so that they can reassess your need for medications and monitor your lab values.  Discharge Instructions    Call MD for:  difficulty breathing, headache or visual disturbances   Complete by:  As directed    Call MD for:  extreme fatigue   Complete by:  As directed    Call MD for:  hives   Complete by:  As directed    Call MD for:  persistant dizziness or light-headedness   Complete by:  As directed    Call MD for:  persistant nausea and vomiting   Complete by:  As directed    Call MD for:  redness, tenderness, or signs of infection (pain, swelling, redness, odor or green/yellow discharge around incision site)   Complete by:  As directed    Call MD for:  severe uncontrolled pain   Complete by:  As directed    Call MD for:  temperature >100.4   Complete by:  As directed    Diet - low sodium heart healthy   Complete by:  As directed    Increase activity slowly   Complete by:  As directed      Allergies as of 04/23/2017   No Known Allergies     Medication List    TAKE these medications   amoxicillin-clavulanate 875-125 MG tablet Commonly known as:  AUGMENTIN Take 1 tablet by mouth every 12 (twelve) hours for 5 days.   aspirin 81 MG EC tablet Take 1 tablet (81 mg total) by mouth daily.   furosemide 40 MG tablet Commonly known as:  LASIX Take 1 tablet (40 mg total) by mouth daily.   irbesartan 75 MG tablet Commonly known as:  AVAPRO Take 1 tablet (75 mg total) by mouth daily.   metoprolol succinate 50 MG 24 hr tablet Commonly known as:  TOPROL XL Take 1 tablet (50 mg total) by mouth daily. Take with or immediately following a meal.    potassium chloride 10 MEQ tablet Commonly known as:  K-DUR,KLOR-CON Take 1 tablet (10 mEq total) by mouth 2 (two) times daily.   spironolactone 25 MG tablet Commonly known as:  ALDACTONE Take 0.5 tablets (12.5 mg total) by mouth daily.       No Known Allergies  Consultations:  None    Procedures/Studies: Dg Chest 2 View  Result Date: 04/21/2017 CLINICAL DATA:  Low oxygen saturation. EXAM: CHEST - 2 VIEW COMPARISON:  04/04/2017 chest radiograph and CT. FINDINGS: Stable fullness in the right hilum. Persistent right basilar densities compatible with pleural fluid and volume loss/consolidation. Heart size remains enlarged. No significant airspace disease or consolidation in the left lung. Atherosclerotic calcifications at the aortic arch. Old right seventh rib fracture. IMPRESSION: Persistent pleural and parenchymal densities in the right lower chest. Findings are compatible with right pleural effusion and consolidation/volume loss. Findings are similar to the previous examination. Electronically Signed   By: Markus Daft M.D.   On: 04/21/2017 13:24   Dg Chest 2 View  Result Date: 04/04/2017 CLINICAL DATA:  Lower extremity edema.  Hypertension. EXAM: CHEST  2 VIEW COMPARISON:  None. FINDINGS: There is consolidation throughout portions of the right middle and lower lobe with small right pleural effusion. The left lung is clear. There is cardiomegaly with pulmonary venous hypertension. There is soft tissue prominence in the right hilum concerning for adenopathy. There is aortic atherosclerosis. There is an old fracture of the right posterolateral seventh rib. No blastic or lytic bone lesions are evident. IMPRESSION: 1. Consolidation throughout much of the right middle lobe as well as in portions of the right lower lobe with right pleural effusion. Suspect pneumonia. 2.  Pulmonary vascular congestion. 3. Prominence of the right hilar region. Question a degree of adenopathy. This finding may well  warrant contrast enhanced chest CT to further evaluate. 4.  Aortic atherosclerosis. Aortic Atherosclerosis (ICD10-I70.0). Electronically Signed   By: Lowella Grip III M.D.   On: 04/04/2017 19:59   Ct Chest Wo Contrast  Result Date: 04/22/2017 CLINICAL DATA:  Pneumonia. EXAM: CT CHEST WITHOUT CONTRAST TECHNIQUE: Multidetector CT imaging of the chest was performed following the standard protocol without IV contrast. COMPARISON:  Chest CT April 04, 2017. FINDINGS: Cardiovascular: Cardiomegaly. Coronary artery calcifications. The thoracic aorta is normal in caliber but atherosclerotic. The central pulmonary arteries are normal in caliber. Mediastinum/Nodes: A large right breast mass is again identified measuring 5.9 by 3.2 cm today versus 5.6 x 2.9 cm previously. Multiple axillary and retropectoral nodes are identified. A prominent right axillary node measures 3.5 by 2.2 cm, unchanged. A few of the nodes are mildly more prominent the interval. The pretracheal nodes seen previously is less well defined today and measures approximately 13 mm in AP dimension today, similar in the interval. No other enlarged nodes within the mediastinum. Moderate right pleural effusion. Tiny left pleural effusion. No pericardial effusion. Diffuse subcutaneous edema. Lungs/Pleura: Central airways are normal. No pneumothorax. Moderate right-sided pleural effusion with underlying atelectasis, similar in the interval. Mild opacities in the right middle lobe and left base are favored to represent atelectasis with infectious etiologies considered less likely. No pulmonary nodules or masses. Upper Abdomen: No acute abnormality. Musculoskeletal: Bony metastatic disease is similar in the interval primarily involving the thoracic spine. IMPRESSION: 1. Findings consistent with right breast cancer metastatic to the right axillary and retropectoral nodes. There is a single mildly prominent node in the right pretracheal region which is  indeterminate. Bony metastatic disease. 2. There is a moderate right pleural effusion with underlying atelectasis, unchanged. 3. Mild opacity in the right middle lobe and left lower lobe are most consistent with atelectasis. 4. Coronary artery calcifications.  Thoracic aortic atherosclerosis. Aortic Atherosclerosis (ICD10-I70.0). Electronically Signed   By: Dorise Bullion III M.D   On: 04/22/2017 09:12   Ct Angio Chest Pe W And/or Wo Contrast  Addendum Date: 04/04/2017   ADDENDUM REPORT: 04/04/2017 23:14 ADDENDUM: Lucent and sclerotic appearance of the right first rib, may reflect additional osseous metastatic focus. Electronically Signed   By: Donavan Foil M.D.   On: 04/04/2017 23:14   Result Date: 04/04/2017 CLINICAL DATA:  Shortness of breath for 1 day EXAM: CT ANGIOGRAPHY CHEST WITH CONTRAST TECHNIQUE: Multidetector CT imaging of the chest was performed using the standard protocol during bolus administration of intravenous contrast. Multiplanar CT image reconstructions and MIPs were obtained to evaluate the vascular anatomy. CONTRAST:  110 mL Isovue 370 intravenous COMPARISON:  Radiograph 04/04/2017 FINDINGS: Cardiovascular: Satisfactory opacification of the pulmonary arteries to the segmental level. No evidence of pulmonary embolism. Nonaneurysmal aorta.  Moderate aortic atherosclerosis. Mild coronary artery calcification. Cardiomegaly with trace pericardial effusion. Mediastinum/Nodes: Midline trachea. No thyroid mass. Enlarged mediastinal lymph nodes. Right pretracheal lymph node measures 12 mm. Right precarinal lymph node measures 16 mm. Esophagus within normal limits. Multiple enlarged right axillary and subpectoral lymph nodes, measuring up to 3.5 cm. Lungs/Pleura: Moderate right-sided pleural effusion. Mild atelectasis in the right middle lobe and right lung base. No pneumothorax. Upper Abdomen: Partially visualized hypodensity in the posterior right hepatic lobe. Adrenal glands are within normal  limits. Musculoskeletal: Large sub areolar mass in the right breast with suggestion of nipple retraction.Mass measures approximately 5.6 x 2.9 by 7.2 cm. Bones demonstrate lucent lesion in the T1 vertebral body, mixed lucency and sclerosis at T2, T8, T11, and T12. Review of the MIP images confirms the above findings. IMPRESSION: 1. Negative for acute pulmonary embolus. Cardiomegaly with moderate right-sided pleural effusion. 2. Large right subareolar breast mass concerning for breast carcinoma. Overlying nipple retraction. Multiple enlarged right axillary and subpectoral lymph nodes concerning for metastatic disease. 3. Enlarged mediastinal lymph nodes, suspect for metastatic disease 4. Multiple lucent and mixed lucent and sclerotic lesions within the spine as above concerning for osseous metastatic disease. Critical Value/emergent results were called by telephone at the time of interpretation on 04/04/2017 at 10:50 pm to Dr. Carlisle Cater , who verbally acknowledged these results. Aortic Atherosclerosis (ICD10-I70.0). Electronically Signed: By: Donavan Foil M.D. On: 04/04/2017 22:50    Discharge Exam: Vitals:   04/23/17 0431 04/23/17 0814  BP: (!) 138/91 (!) 138/91  Pulse: 64 64  Resp: 18   Temp: 97.8 F (36.6 C)   SpO2: 99%    Vitals:   04/22/17 1218 04/22/17 2052 04/23/17 0431 04/23/17 0814  BP: 111/72 120/77 (!) 138/91 (!) 138/91  Pulse: 65 63 64 64  Resp: 20 19 18    Temp: 98.1 F (36.7 C) 98.1 F (36.7 C) 97.8 F (36.6 C)   TempSrc: Oral Oral Oral   SpO2: 95% 98% 99%   Weight: 62.2 kg (137 lb 2 oz)     Height: 5\' 1"  (1.549 m)       General: Pt is alert, awake, not in acute distress Cardiovascular: RRR, S1/S2 +, no rubs, no gallops Respiratory: CTA bilaterally, no wheezing, no rhonchi Abdominal: Soft, NT, ND, bowel sounds + Extremities: no edema, no cyanosis   The results of significant diagnostics from this hospitalization (including imaging, microbiology, ancillary and  laboratory) are listed below for reference.     Microbiology: Recent Results (from the past 240 hour(s))  Culture, blood (Routine x 2)     Status: Abnormal (Preliminary result)   Collection Time: 04/21/17 12:27 PM  Result Value Ref Range Status   Specimen Description   Final    BLOOD RIGHT FOREARM Performed at [facility], [place] [address] [place], [place] [address]    Special Requests   Final    BOTTLES DRAWN AEROBIC AND ANAEROBIC Blood Culture adequate volume Performed at [place] [address], [place], [place] [address]    Culture  Setup Time   Final    GRAM POSITIVE COCCI IN BOTH AEROBIC AND ANAEROBIC BOTTLES CRITICAL RESULT CALLED TO, READ BACK BY AND VERIFIED WITH: N GLOGOVAC PHARMD 04/22/17 0527 JDW    Culture (A)  Final    GROUP A STREP (S.PYOGENES) ISOLATED SUSCEPTIBILITIES TO FOLLOW HEALTH DEPARTMENT NOTIFIED Performed at [facility], [place] [address], [place], [place] [address]    Report Status PENDING  Incomplete  Urine culture     Status: None   Collection Time: 04/21/17 12:27 PM  Result Value Ref Range Status   Specimen Description   Final    URINE, RANDOM Performed at [place] [address], [place], [place] [address]    Special Requests   Final    NONE Performed at [facility], [place] [address] [place], [place] [address]    Culture   Final    NO GROWTH Performed at [facility], [place] [address] [place], [place] [address]    Report Status 04/22/2017 FINAL  Final  Blood Culture ID Panel (Reflexed)     Status: Abnormal   Collection Time: 04/21/17 12:27 PM  Result Value Ref Range Status   Enterococcus species NOT DETECTED NOT DETECTED Final   Listeria monocytogenes NOT DETECTED NOT DETECTED Final   Staphylococcus species NOT DETECTED NOT DETECTED Final   Staphylococcus aureus NOT DETECTED NOT DETECTED Final   Streptococcus species DETECTED (A) NOT DETECTED  Final    Comment: CRITICAL RESULT CALLED TO, READ BACK BY AND VERIFIED WITH: N GLOGOVAC PHARMD 04/22/17 0527 JDW    Streptococcus agalactiae NOT DETECTED NOT DETECTED Final   Streptococcus pneumoniae NOT DETECTED NOT DETECTED Final   Streptococcus pyogenes DETECTED (A) NOT DETECTED Final    Comment: CRITICAL RESULT CALLED TO, READ BACK BY AND VERIFIED WITH: N GLOGOVAC PHARMD 04/22/17 0527 JDW    Acinetobacter baumannii NOT DETECTED NOT DETECTED Final   Enterobacteriaceae species NOT DETECTED NOT DETECTED Final   Enterobacter cloacae complex NOT DETECTED NOT DETECTED Final   Escherichia coli NOT DETECTED NOT DETECTED Final   Klebsiella oxytoca NOT DETECTED NOT DETECTED Final   Klebsiella pneumoniae NOT DETECTED NOT DETECTED Final   Proteus species NOT DETECTED NOT DETECTED Final   Serratia marcescens NOT DETECTED NOT DETECTED Final   Haemophilus influenzae NOT DETECTED NOT DETECTED Final   Neisseria meningitidis NOT DETECTED NOT DETECTED Final   Pseudomonas aeruginosa NOT DETECTED NOT DETECTED Final   Candida albicans NOT DETECTED NOT DETECTED Final   Candida glabrata NOT DETECTED NOT DETECTED Final   Candida krusei NOT DETECTED NOT DETECTED Final   Candida parapsilosis NOT DETECTED NOT DETECTED Final   Candida tropicalis NOT DETECTED NOT DETECTED Final  Culture, blood (Routine x 2)     Status: None (Preliminary result)   Collection Time: 04/21/17 12:37 PM  Result Value Ref Range Status   Specimen Description   Final    BLOOD RIGHT HAND Performed at [facility], [place] [address] [place], [place] [address]    Special Requests   Final    BOTTLES DRAWN AEROBIC AND ANAEROBIC Blood Culture results may not be optimal due to an inadequate volume of blood received in culture bottles Performed at [place] [address] [address], [place], [place] [address]    Culture  Setup Time   Final    GRAM POSITIVE COCCI IN BOTH AEROBIC AND ANAEROBIC BOTTLES CRITICAL  VALUE NOTED.  VALUE IS CONSISTENT WITH PREVIOUSLY REPORTED AND CALLED VALUE.    Culture GRAM POSITIVE COCCI  Final   Report Status PENDING  Incomplete  MRSA PCR Screening     Status: None   Collection Time: 04/21/17  5:43 PM  Result Value Ref Range Status   MRSA by PCR NEGATIVE NEGATIVE Final    Comment:        The GeneXpert MRSA Assay (FDA approved for NASAL specimens only), is one component  of a comprehensive MRSA colonization surveillance program. It is not intended to diagnose MRSA infection nor to guide or monitor treatment for MRSA infections. Performed at [facility], [place] [address], [place], [place] [address]      Labs: BNP (last 3 results) Recent Labs    04/04/17 1416  BNP 6,789.3*   Basic Metabolic Panel: Recent Labs  Lab 04/21/17 1227 04/21/17 1807 04/22/17 0336 04/23/17 0447  NA 138  --  138 135  K 3.9  --  3.4* 4.4  CL 104  --  108 106  CO2 22  --  21* 20*  GLUCOSE 228*  --  140* 169*  BUN 18  --  18 16  CREATININE 0.79  --  0.59 0.74  CALCIUM 9.0  --  8.4* 8.8*  MG  --  1.5*  --  2.1  PHOS  --  3.0  --   --    Liver Function Tests: Recent Labs  Lab 04/21/17 1227 04/22/17 0336  AST 32 26  ALT 24 20  ALKPHOS 161* 108  BILITOT 3.8* 2.3*  PROT 7.7 6.0*  ALBUMIN 3.2* 2.7*   No results for input(s): LIPASE, AMYLASE in the last 168 hours. No results for input(s): AMMONIA in the last 168 hours. CBC: Recent Labs  Lab 04/21/17 1227 04/22/17 0336  WBC 15.0* 9.7  NEUTROABS 13.7*  --   HGB 16.1* 13.7  HCT 47.7* 41.2  MCV 93.3 93.0  PLT 208 172   Cardiac Enzymes: No results for input(s): CKTOTAL, CKMB, CKMBINDEX, TROPONINI in the last 168 hours. BNP: Invalid input(s): POCBNP CBG: No results for input(s): GLUCAP in the last 168 hours. D-Dimer No results for input(s): DDIMER in the last 72 hours. Hgb A1c No results for input(s): HGBA1C in the last 72 hours. Lipid Profile No results for input(s): CHOL, HDL, LDLCALC,  TRIG, CHOLHDL, LDLDIRECT in the last 72 hours. Thyroid function studies No results for input(s): TSH, T4TOTAL, T3FREE, THYROIDAB in the last 72 hours.  Invalid input(s): FREET3 Anemia work up No results for input(s): VITAMINB12, FOLATE, FERRITIN, TIBC, IRON, RETICCTPCT in the last 72 hours. Urinalysis    Component Value Date/Time   COLORURINE AMBER (A) 04/21/2017 1227   APPEARANCEUR CLEAR 04/21/2017 1227   LABSPEC 1.026 04/21/2017 1227   PHURINE 5.0 04/21/2017 1227   GLUCOSEU NEGATIVE 04/21/2017 1227   HGBUR SMALL (A) 04/21/2017 1227   BILIRUBINUR NEGATIVE 04/21/2017 1227   KETONESUR 5 (A) 04/21/2017 1227   PROTEINUR 100 (A) 04/21/2017 1227   NITRITE NEGATIVE 04/21/2017 1227   LEUKOCYTESUR NEGATIVE 04/21/2017 1227   Sepsis Labs Invalid input(s): PROCALCITONIN,  WBC,  LACTICIDVEN Microbiology Recent Results (from the past 240 hour(s))  Culture, blood (Routine x 2)     Status: Abnormal (Preliminary result)   Collection Time: 04/21/17 12:27 PM  Result Value Ref Range Status   Specimen Description   Final    BLOOD RIGHT FOREARM Performed at [facility], [place] [address] [place], [place] [address]    Special Requests   Final    BOTTLES DRAWN AEROBIC AND ANAEROBIC Blood Culture adequate volume Performed at [place] [address] [place], [place] [address]    Culture  Setup Time   Final    GRAM POSITIVE COCCI IN BOTH AEROBIC AND ANAEROBIC BOTTLES CRITICAL RESULT CALLED TO, READ BACK BY AND VERIFIED WITH: N GLOGOVAC PHARMD 04/22/17 0527 JDW    Culture (A)  Final    GROUP A STREP (S.PYOGENES)  ISOLATED SUSCEPTIBILITIES TO FOLLOW HEALTH DEPARTMENT NOTIFIED Performed at [facility], [place] [address] [place], [place] [address]    Report Status PENDING  Incomplete  Urine culture     Status: None   Collection Time: 04/21/17 12:27 PM  Result Value Ref Range Status   Specimen Description   Final    URINE, RANDOM Performed at [place] [address], [place], [place] [address]    Special Requests   Final    NONE Performed at [facility], [place] [address] [place], [place] [address]    Culture   Final    NO GROWTH Performed at [facility], [place] [address], [place], [place] [address]    Report Status 04/22/2017 FINAL  Final  Blood Culture ID Panel (Reflexed)     Status: Abnormal   Collection Time: 04/21/17 12:27 PM  Result Value Ref Range Status   Enterococcus species NOT DETECTED NOT DETECTED Final   Listeria monocytogenes NOT DETECTED NOT DETECTED Final   Staphylococcus species NOT DETECTED NOT DETECTED Final   Staphylococcus aureus NOT DETECTED NOT DETECTED Final   Streptococcus species DETECTED (A) NOT DETECTED Final    Comment: CRITICAL RESULT CALLED TO, READ BACK BY AND VERIFIED WITH: N GLOGOVAC PHARMD 04/22/17 0527 JDW    Streptococcus agalactiae NOT DETECTED NOT DETECTED Final   Streptococcus pneumoniae NOT DETECTED NOT DETECTED Final   Streptococcus pyogenes DETECTED (A) NOT DETECTED Final    Comment: CRITICAL RESULT CALLED TO, READ BACK BY AND VERIFIED WITH: N GLOGOVAC PHARMD 04/22/17 0527 JDW    Acinetobacter baumannii NOT DETECTED NOT DETECTED Final   Enterobacteriaceae species NOT DETECTED NOT DETECTED Final   Enterobacter cloacae complex NOT DETECTED NOT DETECTED Final   Escherichia coli NOT DETECTED NOT DETECTED Final   Klebsiella oxytoca NOT DETECTED NOT DETECTED Final   Klebsiella pneumoniae NOT DETECTED NOT DETECTED Final   Proteus species NOT DETECTED NOT DETECTED Final   Serratia marcescens NOT DETECTED NOT DETECTED Final   Haemophilus influenzae NOT DETECTED NOT DETECTED Final   Neisseria meningitidis NOT DETECTED NOT DETECTED Final   Pseudomonas aeruginosa NOT DETECTED NOT DETECTED Final   Candida albicans NOT DETECTED NOT DETECTED Final   Candida glabrata NOT DETECTED NOT DETECTED Final   Candida krusei NOT DETECTED NOT DETECTED Final    Candida parapsilosis NOT DETECTED NOT DETECTED Final   Candida tropicalis NOT DETECTED NOT DETECTED Final  Culture, blood (Routine x 2)     Status: None (Preliminary result)   Collection Time: 04/21/17 12:37 PM  Result Value Ref Range Status   Specimen Description   Final    BLOOD RIGHT HAND Performed at [facility], [place] [address], [place], [place] [address]    Special Requests   Final    BOTTLES DRAWN AEROBIC AND ANAEROBIC Blood Culture results may not be optimal due to an inadequate volume of blood received in culture bottles Performed at [place] [address] [place],  [address]    Culture  Setup Time   Final    GRAM POSITIVE COCCI IN BOTH AEROBIC AND ANAEROBIC BOTTLES CRITICAL VALUE NOTED.  VALUE IS CONSISTENT WITH PREVIOUSLY REPORTED AND CALLED VALUE.    Culture GRAM POSITIVE COCCI  Final   Report Status PENDING  Incomplete  MRSA PCR Screening     Status: None   Collection Time: 04/21/17  5:43 PM  Result Value Ref Range Status   MRSA by PCR  NEGATIVE NEGATIVE Final    Comment:        The GeneXpert MRSA Assay (FDA approved for NASAL specimens only), is one component of a comprehensive MRSA colonization surveillance program. It is not intended to diagnose MRSA infection nor to guide or monitor treatment for MRSA infections. Performed at [facility], [place] [address] [place], [place] [address]     Time coordinating discharge: 35 minutes  SIGNED:  Chipper Oman, MD  [facility] 04/23/2017, 11:05 AM  Pager please text page via  [email or web address]  Note - This record has been created using [employer]. Chart creation errors have been sought, but may not always have been located. Such creation errors do not reflect on the standard of medical care.

## 2017-04-24 ENCOUNTER — Telehealth: Payer: Self-pay | Admitting: Cardiovascular Disease

## 2017-04-24 DIAGNOSIS — E876 Hypokalemia: Secondary | ICD-10-CM | POA: Diagnosis not present

## 2017-04-24 DIAGNOSIS — I110 Hypertensive heart disease with heart failure: Secondary | ICD-10-CM | POA: Diagnosis not present

## 2017-04-24 DIAGNOSIS — R600 Localized edema: Secondary | ICD-10-CM | POA: Diagnosis not present

## 2017-04-24 DIAGNOSIS — I429 Cardiomyopathy, unspecified: Secondary | ICD-10-CM | POA: Diagnosis not present

## 2017-04-24 DIAGNOSIS — I5043 Acute on chronic combined systolic (congestive) and diastolic (congestive) heart failure: Secondary | ICD-10-CM | POA: Diagnosis not present

## 2017-04-24 DIAGNOSIS — C50911 Malignant neoplasm of unspecified site of right female breast: Secondary | ICD-10-CM | POA: Diagnosis not present

## 2017-04-24 LAB — CULTURE, BLOOD (ROUTINE X 2): Special Requests: ADEQUATE

## 2017-04-24 NOTE — Telephone Encounter (Signed)
Will route to Dr. Acie Fredrickson for the ok for pt to resume [facility] for CHF and PNA.

## 2017-04-24 NOTE — Telephone Encounter (Signed)
New message     [facility] needs a verbal order to resume care- patient has been released from the hospital  For CHF and pneumonia

## 2017-04-25 NOTE — Telephone Encounter (Signed)
Evelyn Riley from home health  Is aware that Dr Acie Fredrickson okay to continue health for CHF. [name] verbalized understanding.

## 2017-04-25 NOTE — Telephone Encounter (Signed)
Follow up    [name] from [facility] calling to request verbal order for resumption of care.  [name] [phone]

## 2017-04-25 NOTE — Telephone Encounter (Signed)
OK to continue home health for CHF

## 2017-04-25 NOTE — Telephone Encounter (Signed)
Evelyn Riley from [facility] called again today to request of a verbal order for resumption of home heath care of pt for CHF . [name] is aware that this message was sent to Dr Acie Fredrickson MD yesterday. We will call her back when we do. [name] verbalized understanding.

## 2017-05-01 ENCOUNTER — Encounter: Payer: Self-pay | Admitting: Cardiovascular Disease

## 2017-05-01 ENCOUNTER — Ambulatory Visit (INDEPENDENT_AMBULATORY_CARE_PROVIDER_SITE_OTHER): Payer: Medicare Other | Admitting: Cardiovascular Disease

## 2017-05-01 VITALS — BP 128/78 | HR 55 | Ht 61.0 in | Wt 132.4 lb

## 2017-05-01 DIAGNOSIS — I5022 Chronic systolic (congestive) heart failure: Secondary | ICD-10-CM | POA: Diagnosis not present

## 2017-05-01 NOTE — Progress Notes (Signed)
Cardiology Office Note:    Date:  05/01/2017   ID:  [name], DOB [birth year], MRN [id number]  PCP:  Billie Ruddy, MD  Cardiologist:  Mertie Moores, MD   Referring MD: Orlena Sheldon, PA-C   Chief Complaint  Patient presents with  . Congestive Heart Failure    May 01, 2017    Evelyn Riley is a 72 y.o. female with a hx of metastatic breast cancer and a new diagnosis of congestive heart failure.  I met her in the hospital several weeks ago.  She does not want to have any therapy or treatment or diagnosis of her breast cancer.  She was hospitalized with sepsis last week - secondary to pneumonia . Was at [facility]  She is on ARB,  metoprolol, Aldactone .   Doing well , tolerating the meds well .  Is losing weight  Will be seeing Grier Mitts, MD for primary     Past Medical History:  Diagnosis Date  . Hypertension     History reviewed. No pertinent surgical history.  Current Medications: Current Meds  Medication Sig  . aspirin 81 MG EC tablet Take 1 tablet (81 mg total) by mouth daily.  . furosemide (LASIX) 40 MG tablet Take 1 tablet (40 mg total) by mouth daily.  . irbesartan (AVAPRO) 75 MG tablet Take 1 tablet (75 mg total) by mouth daily.  . metoprolol succinate (TOPROL XL) 50 MG 24 hr tablet Take 1 tablet (50 mg total) by mouth daily. Take with or immediately following a meal.  . potassium chloride (K-DUR,KLOR-CON) 10 MEQ tablet Take 1 tablet (10 mEq total) by mouth 2 (two) times daily.  Marland Kitchen spironolactone (ALDACTONE) 25 MG tablet Take 0.5 tablets (12.5 mg total) by mouth daily.     Allergies:   Patient has no known allergies.   Social History   Socioeconomic History  . Marital status: Single    Spouse name: None  . Number of children: None  . Years of education: None  . Highest education level: None  Social Needs  . Financial resource strain: None  . Food insecurity - worry: None  . Food insecurity - inability: None  . Transportation needs -  medical: None  . Transportation needs - non-medical: None  Occupational History  . None  Tobacco Use  . Smoking status: Never Smoker  . Smokeless tobacco: Never Used  Substance and Sexual Activity  . Alcohol use: No  . Drug use: No  . Sexual activity: None  Other Topics Concern  . None  Social History Narrative  . None     Family History: The patient's family history includes Heart disease (age of onset: 77) in her father; Hypertension in her mother.  ROS:   Please see the history of present illness.     All other systems reviewed and are negative.  EKGs/Labs/Other Studies Reviewed:    The following studies were reviewed today:   EKG:    Recent Labs: 04/04/2017: B Natriuretic Peptide 1,060.2 04/22/2017: ALT 20; Hemoglobin 13.7; Platelets 172 04/23/2017: BUN 16; Creatinine, Ser 0.74; Magnesium 2.1; Potassium 4.4; Sodium 135  Recent Lipid Panel No results found for: CHOL, TRIG, HDL, CHOLHDL, VLDL, LDLCALC, LDLDIRECT  Physical Exam:    VS:  BP 128/78   Pulse (!) 55   Ht '5\' 1"'  (1.549 m)   Wt 132 lb 6.4 oz (60.1 kg)   SpO2 96%   BMI 25.02 kg/m     Wt Readings from Last 3 Encounters:  05/01/17 132 lb 6.4 oz (60.1 kg)  04/22/17 137 lb 2 oz (62.2 kg)  04/14/17 139 lb (63 kg)     GEN:  Well nourished, well developed in no acute distress HEENT: Normal NECK: No JVD; No carotid bruits LYMPHATICS: No lymphadenopathy CARDIAC: RRR, no murmurs, rubs, gallops,  Right breast mass , extending to R axillary region  RESPIRATORY:  Clear to auscultation without rales, wheezing or rhonchi  ABDOMEN: Soft, non-tender, non-distended MUSCULOSKELETAL:  No edema; No deformity  SKIN: Warm and dry NEUROLOGIC:  Alert and oriented x 3 PSYCHIATRIC:  Normal affect   ASSESSMENT:    1. Chronic systolic heart failure (HCC)    PLAN:     1. Chronic systolic congestive heart failure: [place] seems to be doing very well.  She is on stable medical therapy.  She is tolerating the medicines well  and is breathing quite a bit better.  Continue current medications. We will be relatively conservative in our heart failure approach.  2.  Metastatic breast cancer:   She does not want any formal diagnosis or treatment  Palliative care when appropriate.   Medication Adjustments/Labs and Tests Ordered: Current medicines are reviewed at length with the patient today.  Concerns regarding medicines are outlined above.  Orders Placed This Encounter  Procedures  . Basic Metabolic Panel (BMET)   No orders of the defined types were placed in this encounter.   Signed, Mertie Moores, MD  05/01/2017 11:53 AM    [place]

## 2017-05-01 NOTE — Patient Instructions (Signed)
Medication Instructions:  Your physician recommends that you continue on your current medications as directed. Please refer to the Current Medication list given to you today.   Labwork: Your physician recommends that you return for lab work in: 3 months at your next appointment for basic metabolic panel   Testing/Procedures: None Ordered   Follow-Up: Your physician recommends that you schedule a follow-up appointment in: 3 months with Dr. Acie Fredrickson or Richardson Dopp, PA   If you need a refill on your cardiac medications before your next appointment, please call your pharmacy.   Thank you for choosing [facility]! Christen Bame, RN [phone]

## 2017-05-05 ENCOUNTER — Encounter: Payer: Self-pay | Admitting: Family Medicine

## 2017-05-05 ENCOUNTER — Ambulatory Visit (INDEPENDENT_AMBULATORY_CARE_PROVIDER_SITE_OTHER): Payer: Medicare Other | Admitting: Family Medicine

## 2017-05-05 VITALS — BP 126/76 | HR 75 | Temp 97.6°F | Ht 60.5 in | Wt 126.0 lb

## 2017-05-05 DIAGNOSIS — I10 Essential (primary) hypertension: Secondary | ICD-10-CM

## 2017-05-05 DIAGNOSIS — Z7689 Persons encountering health services in other specified circumstances: Secondary | ICD-10-CM

## 2017-05-05 DIAGNOSIS — C7951 Secondary malignant neoplasm of bone: Secondary | ICD-10-CM

## 2017-05-05 DIAGNOSIS — C50911 Malignant neoplasm of unspecified site of right female breast: Secondary | ICD-10-CM

## 2017-05-05 DIAGNOSIS — I5042 Chronic combined systolic (congestive) and diastolic (congestive) heart failure: Secondary | ICD-10-CM

## 2017-05-05 DIAGNOSIS — R238 Other skin changes: Secondary | ICD-10-CM

## 2017-05-05 NOTE — Patient Instructions (Signed)
Bone Metastasis Bone metastasis is cancer that spreads to the bones from another part of the body. A person may have bone metastasis in one bone or in more than one bone. Cancer that spreads to the bones is different from cancer that starts in the bones (primary bone cancer). Bone metastasis is more common than primary bone cancer. The spine is the most common area for bone metastasis. Other common areas include:  Hip bone (pelvis).  Ribs.  Skull.  Long bones of the arm or leg.  Bone metastasis is painful, and it damages the bones. Bone metastasis damages and weakens bones in two ways. A person may have bone destruction (osteolytic damage) or abnormal bone growth (osteoblastic destruction). Both of these conditions can make bones so weak that they break (pathologic fracture) even from a minor injury. What are the causes? This condition is caused by cancer cells that spread to bone. These cells can get into your bloodstream and spread through your body. They can also get into the vessels that are part of your lymphatic system (lymph vessels) and spread that way. What increases the risk? This condition is more likely to develop in people who have an advanced type of cancer that is known to spread to bone. Cancers that often spread to bone include:  Breast cancer.  Prostate cancer.  Lung cancer.  Thyroid cancer.  Kidney cancer.  What are the signs or symptoms? The most common symptom of this condition is bone pain, especially while you are resting. Other symptoms include:  A broken bone (fracture) that happens with little or no trauma.  Low number of red blood cells (anemia). Bone destruction may damage the spongy tissue (bone marrow) in the center of some bones where red blood cells are produced. Anemia can cause: ? Weakness. ? Shortness of breath. ? Headache. ? Dizziness.  Back or neck pain with numbness or weakness, especially if you have bone metastasis in your spine.  High  levels of calcium in your blood (hypercalcemia). When bone is destroyed, calcium is released into your blood. Symptoms of hypercalcemia include: ? Constipation. ? Thirst. ? Nausea. ? Sleepiness.  How is this diagnosed? This condition may be diagnosed based on:  Your symptoms and medical history. Your health care provider may suspect this condition if you are being treated for cancer or have had cancer treatment in the past.  A physical exam.  Imaging studies, such as: ? Bone X-rays, especially in the area where you have pain. ? CT scan. ? Bone scan. ? MRI.  Blood tests to check for anemia or hypercalcemia.  A procedure to remove a piece of bone so it can be examined under a microscope (biopsy).  How is this treated? Treatment for this condition depends on your overall health, the type of cancer you have, and how much the cancer has spread. You will work with a team of health care providers to determine which treatment is best for you. Treatment will focus on managing pain, preventing bone weakness, and slowing the spread of the cancer. Treatment may include:  Radiation therapy. This treatment uses X-rays to kill cancer cells. It is most effective for reducing pain, stopping tumor growth, and lowering the risk of fractures.  Radioisotope therapy. This treatment uses a radioactive medicine that is injected into your blood. The medicine travels to areas where cancer cells are active and kills them.  Chemotherapy. For this treatment, you are given cancer-killing drugs. You may have chemotherapy in cycles, with rest periods  in between.  Medicines to block cells that destroy bone (bisphosphonates and denosumab). These medicines are used to control bone pain. They may help to reduce hypercalcemia.  Medicines to reduce pain (opiates).  Endocrine therapies. These therapies slow cancer growth by blocking specific chemical messengers (hormones). Some types of cancer, including breast and  prostate cancers, depend on hormones.  Targeted therapies. These therapies involve the use of drugs that block the growth and spread of cancer. This treatment is different from standard chemotherapy.  Immunotherapies. These therapies use the body's defense system (immune system) to fight cancer cells.  Surgery. You may have surgery to remove bone cancer or to prevent or repair a fracture.  Follow these instructions at home:  Take medicines only as directed by your health care provider.  Do not drive or operate heavy machinery while taking pain medicine.  Take the following steps to help prevent constipation, which can result from some pain medicines. ? Include plenty of fruits and whole grains in your diet. ? Drink enough fluid to keep your urine clear or pale yellow. ? Ask your health care provider about taking a laxative if needed.  Keep all follow-up visits as directed by your health care provider. This is important. Contact a health care provider if:  Your pain medicine is not helping.  You are too weak to care for yourself at home. Get help right away if:  You fall and have pain or an injury.  You have trouble walking.  You have numbness or tingling in your legs.  Your pain suddenly gets worse.  You lose control of your bowels or your bladder.  You are very sleepy or confused. This information is not intended to replace advice given to you by your health care provider. Make sure you discuss any questions you have with your health care provider. Document Released: 01/21/2002 Document Revised: 07/09/2015 Document Reviewed: 12/04/2013 Elsevier Interactive Patient Education  2018 [employer].  Heart Failure Heart failure is a condition in which the heart has trouble pumping blood because it has become weak or stiff. This means that the heart does not pump blood efficiently for the body to work well. For some people with heart failure, fluid may back up into the lungs and  there may be swelling (edema) in the lower legs. Heart failure is usually a long-term (chronic) condition. It is important for you to take good care of yourself and follow the treatment plan from your health care provider. What are the causes? This condition is caused by some health problems, including:  High blood pressure (hypertension). Hypertension causes the heart muscle to work harder than normal. High blood pressure eventually causes the heart to become stiff and weak.  Coronary artery disease (CAD). CAD is the buildup of cholesterol and fat (plaques) in the arteries of the heart.  Heart attack (myocardial infarction). Injured tissue, which is caused by the heart attack, does not contract as well and the heart's ability to pump blood is weakened.  Abnormal heart valves. When the heart valves do not open and close properly, the heart muscle must pump harder to keep the blood flowing.  Heart muscle disease (cardiomyopathy or myocarditis). Heart muscle disease is damage to the heart muscle from a variety of causes, such as drug or alcohol abuse, infections, or unknown causes. These can increase the risk of heart failure.  Lung disease. When the lungs do not work properly, the heart must work harder.  What increases the risk? Risk of heart  failure increases as a person ages. This condition is also more likely to develop in people who:  Are overweight.  Are female.  Smoke or chew tobacco.  Abuse alcohol or illegal drugs.  Have taken medicines that can damage the heart, such as chemotherapy drugs.  Have diabetes. ? High blood sugar (glucose) is associated with high fat (lipid) levels in the blood. ? Diabetes can also damage tiny blood vessels that carry nutrients to the heart muscle.  Have abnormal heart rhythms.  Have thyroid problems.  Have low blood counts (anemia).  What are the signs or symptoms? Symptoms of this condition include:  Shortness of breath with activity,  such as when climbing stairs.  Persistent cough.  Swelling of the feet, ankles, legs, or abdomen.  Unexplained weight gain.  Difficulty breathing when lying flat (orthopnea).  Waking from sleep because of the need to sit up and get more air.  Rapid heartbeat.  Fatigue and loss of energy.  Feeling light-headed, dizzy, or close to fainting.  Loss of appetite.  Nausea.  Increased urination during the night (nocturia).  Confusion.  How is this diagnosed? This condition is diagnosed based on:  Medical history, symptoms, and a physical exam.  Diagnostic tests, which may include: ? Echocardiogram. ? Electrocardiogram (ECG). ? Chest X-ray. ? Blood tests. ? Exercise stress test. ? Radionuclide scans. ? Cardiac catheterization and angiogram.  How is this treated? Treatment for this condition is aimed at managing the symptoms of heart failure. Medicines, behavioral changes, or other treatments may be necessary to treat heart failure. Medicines These may include:  Angiotensin-converting enzyme (ACE) inhibitors. This type of medicine blocks the effects of a blood protein called angiotensin-converting enzyme. ACE inhibitors relax (dilate) the blood vessels and help to lower blood pressure.  Angiotensin receptor blockers (ARBs). This type of medicine blocks the actions of a blood protein called angiotensin. ARBs dilate the blood vessels and help to lower blood pressure.  Water pills (diuretics). Diuretics cause the kidneys to remove salt and water from the blood. The extra fluid is removed through urination, leaving a lower volume of blood that the heart has to pump.  Beta blockers. These improve heart muscle strength and they prevent the heart from beating too quickly.  Digoxin. This increases the force of the heartbeat.  Healthy behavior changes These may include:  Reaching and maintaining a healthy weight.  Stopping smoking or chewing tobacco.  Eating heart-healthy  foods.  Limiting or avoiding alcohol.  Stopping use of street drugs (illegal drugs).  Physical activity.  Other treatments These may include:  Surgery to open blocked coronary arteries or repair damaged heart valves.  Placement of a biventricular pacemaker to improve heart muscle function (cardiac resynchronization therapy). This device paces both the right ventricle and left ventricle.  Placement of a device to treat serious abnormal heart rhythms (implantable cardioverter defibrillator, or ICD).  Placement of a device to improve the pumping ability of the heart (left ventricular assist device, or LVAD).  Heart transplant. This can cure heart failure, and it is considered for certain patients who do not improve with other therapies.  Follow these instructions at home: Medicines  Take over-the-counter and prescription medicines only as told by your health care provider. Medicines are important in reducing the workload of your heart, slowing the progression of heart failure, and improving your symptoms. ? Do not stop taking your medicine unless your health care provider told you to do that. ? Do not skip any dose of medicine. ?  Refill your prescriptions before you run out of medicine. You need your medicines every day. Eating and drinking   Eat heart-healthy foods. Talk with a dietitian to make an eating plan that is right for you. ? Choose foods that contain no trans fat and are low in saturated fat and cholesterol. Healthy choices include fresh or frozen fruits and vegetables, fish, lean meats, legumes, fat-free or low-fat dairy products, and whole-grain or high-fiber foods. ? Limit salt (sodium) if directed by your health care provider. Sodium restriction may reduce symptoms of heart failure. Ask a dietitian to recommend heart-healthy seasonings. ? Use healthy cooking methods instead of frying. Healthy methods include roasting, grilling, broiling, baking, poaching, steaming, and  stir-frying.  Limit your fluid intake if directed by your health care provider. Fluid restriction may reduce symptoms of heart failure. Lifestyle  Stop smoking or using chewing tobacco. Nicotine and tobacco can damage your heart and your blood vessels. Do not use nicotine gum or patches before talking to your health care provider.  Limit alcohol intake to no more than 1 drink per day for non-pregnant women and 2 drinks per day for men. One drink equals 12 oz of beer, 5 oz of wine, or 1 oz of hard liquor. ? Drinking more than that is harmful to your heart. Tell your health care provider if you drink alcohol several times a week. ? Talk with your health care provider about whether any level of alcohol use is safe for you. ? If your heart has already been damaged by alcohol or you have severe heart failure, drinking alcohol should be stopped completely.  Stop use of illegal drugs.  Lose weight if directed by your health care provider. Weight loss may reduce symptoms of heart failure.  Do moderate physical activity if directed by your health care provider. People who are elderly and people with severe heart failure should consult with a health care provider for physical activity recommendations. Monitor important information  Weigh yourself every day. Keeping track of your weight daily helps you to notice excess fluid sooner. ? Weigh yourself every morning after you urinate and before you eat breakfast. ? Wear the same amount of clothing each time you weigh yourself. ? Record your daily weight. Provide your health care provider with your weight record.  Monitor and record your blood pressure as told by your health care provider.  Check your pulse as told by your health care provider. Dealing with extreme temperatures  If the weather is extremely hot: ? Avoid vigorous physical activity. ? Use air conditioning or fans or seek a cooler location. ? Avoid caffeine and alcohol. ? Wear  loose-fitting, lightweight, and light-colored clothing.  If the weather is extremely cold: ? Avoid vigorous physical activity. ? Layer your clothes. ? Wear mittens or gloves, a hat, and a scarf when you go outside. ? Avoid alcohol. General instructions  Manage other health conditions such as hypertension, diabetes, thyroid disease, or abnormal heart rhythms as told by your health care provider.  Learn to manage stress. If you need help to do this, ask your health care provider.  Plan rest periods when fatigued.  Get ongoing education and support as needed.  Participate in or seek rehabilitation as needed to maintain or improve independence and quality of life.  Stay up to date with immunizations. Keeping current on pneumococcal and influenza immunizations is especially important to prevent respiratory infections.  Keep all follow-up visits as told by your health care provider. This is important.  Contact a health care provider if:  You have a rapid weight gain.  You have increasing shortness of breath that is unusual for you.  You are unable to participate in your usual physical activities.  You tire easily.  You cough more than normal, especially with physical activity.  You have any swelling or more swelling in areas such as your hands, feet, ankles, or abdomen.  You are unable to sleep because it is hard to breathe.  You feel like your heart is beating quickly (palpitations).  You become dizzy or light-headed when you stand up. Get help right away if:  You have difficulty breathing.  You notice or your family notices a change in your awareness, such as having trouble staying awake or having difficulty with concentration.  You have pain or discomfort in your chest.  You have an episode of fainting (syncope). This information is not intended to replace advice given to you by your health care provider. Make sure you discuss any questions you have with your health care  provider. Document Released: 01/31/2005 Document Revised: 10/06/2015 Document Reviewed: 08/26/2015 Elsevier Interactive Patient Education  2018 [employer]. High-Protein and High-Calorie Diet Eating high-protein and high-calorie foods can help you to gain weight, heal after an injury, and recover after an illness or surgery. What is my plan? The specific amount of daily protein and calories you need depends on:  Your body weight.  The reason this diet is recommended for you.  Generally, a high-protein, high-calorie diet involves:  Eating 250-500 extra calories each day.  Making sure that 10-35% of your daily calories come from protein.  Talk to your health care provider about how much protein and how many calories you need each day. Follow the diet as directed by your health care provider. What do I need to know about this diet?  Ask your health care provider if you should take a nutritional supplement.  Try to eat six small meals each day instead of three large meals.  Eat a balanced diet, including one food that is high in protein at each meal.  Keep nutritious snacks handy, such as nuts, trail mixes, dried fruit, and yogurt.  If you have kidney disease or diabetes, eating too much protein may put extra stress on your kidneys. Talk to your health care provider if you have either of those conditions. What are some high-protein foods? Grains Quinoa. Bulgur wheat. Vegetables Soybeans. Peas. Meats and Other Protein Sources Beef, pork, and poultry. Fish and seafood. Eggs. Tofu. Textured vegetable protein (TVP). Peanut butter. Nuts and seeds. Dried beans. Protein powders. Dairy Whole milk. Whole-milk yogurt. Powdered milk. Cheese. [employer]. Eggnog. Beverages High-protein supplement drinks. Soy milk. Other Protein bars. The items listed above may not be a complete list of recommended foods or beverages. Contact your dietitian for more options. What are some high-calorie  foods? Grains Pasta. Quick breads. Muffins. Pancakes. Ready-to-eat cereal. Vegetables Vegetables cooked in oil or butter. Fried potatoes. Fruits Dried fruit. Fruit leather. Canned fruit in syrup. Fruit juice. Avocados. Meats and Other Protein Sources Peanut butter. Nuts and seeds. Dairy Heavy cream. Whipped cream. Cream cheese. Sour cream. Ice cream. Custard. Pudding. Beverages Meal-replacement beverages. Nutrition shakes. Fruit juice. Sugar-sweetened soft drinks. Condiments Salad dressing. Mayonnaise. Alfredo sauce. Fruit preserves or jelly. Honey. Syrup. Sweets/Desserts Cake. Cookies. Pie. Pastries. Candy bars. Chocolate. Fats and Oils Butter or margarine. Oil. Gravy. Other Meal-replacement bars. The items listed above may not be a complete list of recommended foods or beverages. Contact  your dietitian for more options. What are some tips for including high-protein and high-calorie foods in my diet?  Add whole milk, half-and-half, or heavy cream to cereal, pudding, soup, or hot cocoa.  Add whole milk to instant breakfast drinks.  Add peanut butter to oatmeal or smoothies.  Add powdered milk to baked goods, smoothies, or milkshakes.  Add powdered milk, cream, or butter to mashed potatoes.  Add cheese to cooked vegetables.  Make whole-milk yogurt parfaits. Top them with granola, fruit, or nuts.  Add cottage cheese to your fruit.  Add avocados, cheese, or both to sandwiches or salads.  Add meat, poultry, or seafood to rice, pasta, casseroles, salads, and soups.  Use mayonnaise when making egg salad, chicken salad, or tuna salad.  Use peanut butter as a topping for pretzels, celery, or crackers.  Add beans to casseroles, dips, and spreads.  Add pureed beans to sauces and soups.  Replace calorie-free drinks with calorie-containing drinks, such as milk and fruit juice. This information is not intended to replace advice given to you by your health care provider. Make  sure you discuss any questions you have with your health care provider. Document Released: 01/31/2005 Document Revised: 07/09/2015 Document Reviewed: 07/16/2013 Elsevier Interactive Patient Education  [employer].

## 2017-05-05 NOTE — Progress Notes (Signed)
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  Result Value Ref Range   Sodium 138 135 - 145 mmol/L   Potassium 3.4 (L) 3.5 - 5.1 mmol/L   Chloride 108 101 - 111 mmol/L   CO2 21 (L) 22 - 32 mmol/L   Glucose, Bld 140 (H) 65 -  99 mg/dL   BUN 18 6 - 20 mg/dL   Creatinine, Ser 0.59 0.44 - 1.00 mg/dL   Calcium 8.4 (L) 8.9 - 10.3 mg/dL   Total Protein 6.0 (L) 6.5 - 8.1 g/dL   Albumin 2.7 (L) 3.5 - 5.0 g/dL   AST 26 15 - 41 U/L   ALT 20 14 - 54 U/L   Alkaline Phosphatase 108 38 - 126 U/L   Total Bilirubin 2.3 (H) 0.3 - 1.2 mg/dL   GFR calc non Af Amer >60 >60 mL/min   GFR calc Af Amer >60 >60 mL/min    Comment: (NOTE) The eGFR has been calculated using the CKD EPI equation. This calculation has not been validated in all clinical situations. eGFR's persistently <60 mL/min signify possible Chronic Kidney Disease.    Anion gap 9 5 - 15    Comment: Performed at [facility], [place] [address], [place], [place] [address]  CBC     Status: None   Collection Time: 04/22/17  3:36 AM  Result Value Ref Range   WBC 9.7 4.0 - 10.5 K/uL   RBC 4.43 3.87 - 5.11 MIL/uL   Hemoglobin 13.7 12.0 - 15.0 g/dL   HCT 41.2 36.0 - 46.0 %   MCV 93.0 78.0 - 100.0 fL   MCH 30.9 26.0 - 34.0 pg   MCHC 33.3 30.0 - 36.0 g/dL   RDW 15.5 11.5 - 15.5 %   Platelets 172 150 - 400 K/uL    Comment: Performed at [facility], [place] [address], [place],  [address]  Basic metabolic panel     Status: Abnormal   Collection Time: 04/23/17  4:47 AM  Result Value Ref Range   Sodium 135 135 - 145 mmol/L   Potassium 4.4 3.5 - 5.1 mmol/L    Comment: DELTA CHECK NOTED   Chloride 106 101 - 111 mmol/L   CO2 20 (L) 22 - 32 mmol/L   Glucose, Bld 169 (H) 65 - 99 mg/dL   BUN 16 6 - 20 mg/dL   Creatinine, Ser 0.74 0.44 - 1.00 mg/dL   Calcium 8.8 (L) 8.9 - 10.3 mg/dL   GFR calc non Af Amer >60 >60 mL/min   GFR calc Af Amer >60 >60 mL/min    Comment: (NOTE) The eGFR has been calculated using the CKD EPI equation.  This calculation has not been validated in all clinical situations. eGFR's persistently <60 mL/min signify possible Chronic Kidney Disease.    Anion gap 9 5 - 15    Comment: Performed at [facility], [place] [address], [place], [place] [address]  Magnesium     Status: None   Collection Time: 04/23/17  4:47 AM  Result Value Ref Range   Magnesium 2.1 1.7 - 2.4 mg/dL    Comment: Performed at [facility], [place] [address] [place], [place] [address]    Assessment/Plan: Essential hypertension -Controlled -Continue irbesartan 75 mg daily  Chronic combined systolic and diastolic congestive heart failure ([place]) -Echo 04/05/17 EF 20-25%, mitral valve with mild regurg, left atrium mildly dilated, right ventricle mildly dilated, right atrium mildly dilated, tricuspid valve severe regurg, pulmonary artery pressure moderately increased -Continue Lasix 40 mg, metoprolol succinate 50 mg, K Dur 10 mEq, Spironolactone 12.5 mg daily -Continue follow-up with cardiology. -Patient advised to contact cardiology for signs of lower extremity edema/shortness of breath  Breast cancer metastasized to bone, right ([place]) -Patient does not wish to pursue treatment -We will continue to monitor as pain likely to become an issue -Pt has a will in place  Venous lake of lip -Stable  Encounter to establish care -We reviewed the PMH, PSH, FH, SH, Meds and Allergies. -We provided refills for any medications we will prescribe as needed. -We addressed current concerns per orders and patient instructions. -We have asked for records for pertinent exams, studies, vaccines and notes from previous providers. -We have advised patient to follow up per instructions below.  Follow-up in the next few months, sooner if needed  Grier Mitts, MD

## 2017-05-07 ENCOUNTER — Other Ambulatory Visit: Payer: Self-pay | Admitting: Physician Assistant

## 2017-05-10 DIAGNOSIS — I110 Hypertensive heart disease with heart failure: Secondary | ICD-10-CM | POA: Diagnosis not present

## 2017-05-10 DIAGNOSIS — I5043 Acute on chronic combined systolic (congestive) and diastolic (congestive) heart failure: Secondary | ICD-10-CM | POA: Diagnosis not present

## 2017-05-10 DIAGNOSIS — E876 Hypokalemia: Secondary | ICD-10-CM | POA: Diagnosis not present

## 2017-05-10 DIAGNOSIS — R600 Localized edema: Secondary | ICD-10-CM | POA: Diagnosis not present

## 2017-05-10 DIAGNOSIS — I429 Cardiomyopathy, unspecified: Secondary | ICD-10-CM | POA: Diagnosis not present

## 2017-05-10 DIAGNOSIS — C50911 Malignant neoplasm of unspecified site of right female breast: Secondary | ICD-10-CM | POA: Diagnosis not present

## 2017-05-17 DIAGNOSIS — C50911 Malignant neoplasm of unspecified site of right female breast: Secondary | ICD-10-CM | POA: Diagnosis not present

## 2017-05-17 DIAGNOSIS — R600 Localized edema: Secondary | ICD-10-CM | POA: Diagnosis not present

## 2017-05-17 DIAGNOSIS — I5043 Acute on chronic combined systolic (congestive) and diastolic (congestive) heart failure: Secondary | ICD-10-CM | POA: Diagnosis not present

## 2017-05-17 DIAGNOSIS — I110 Hypertensive heart disease with heart failure: Secondary | ICD-10-CM | POA: Diagnosis not present

## 2017-05-17 DIAGNOSIS — I429 Cardiomyopathy, unspecified: Secondary | ICD-10-CM | POA: Diagnosis not present

## 2017-05-17 DIAGNOSIS — E876 Hypokalemia: Secondary | ICD-10-CM | POA: Diagnosis not present

## 2017-05-23 DIAGNOSIS — R600 Localized edema: Secondary | ICD-10-CM | POA: Diagnosis not present

## 2017-05-23 DIAGNOSIS — I5043 Acute on chronic combined systolic (congestive) and diastolic (congestive) heart failure: Secondary | ICD-10-CM | POA: Diagnosis not present

## 2017-05-23 DIAGNOSIS — E876 Hypokalemia: Secondary | ICD-10-CM | POA: Diagnosis not present

## 2017-05-23 DIAGNOSIS — I110 Hypertensive heart disease with heart failure: Secondary | ICD-10-CM | POA: Diagnosis not present

## 2017-05-23 DIAGNOSIS — I429 Cardiomyopathy, unspecified: Secondary | ICD-10-CM | POA: Diagnosis not present

## 2017-05-23 DIAGNOSIS — C50911 Malignant neoplasm of unspecified site of right female breast: Secondary | ICD-10-CM | POA: Diagnosis not present

## 2017-06-06 DIAGNOSIS — C50911 Malignant neoplasm of unspecified site of right female breast: Secondary | ICD-10-CM | POA: Diagnosis not present

## 2017-06-06 DIAGNOSIS — I429 Cardiomyopathy, unspecified: Secondary | ICD-10-CM | POA: Diagnosis not present

## 2017-06-06 DIAGNOSIS — E876 Hypokalemia: Secondary | ICD-10-CM | POA: Diagnosis not present

## 2017-06-06 DIAGNOSIS — I110 Hypertensive heart disease with heart failure: Secondary | ICD-10-CM | POA: Diagnosis not present

## 2017-06-06 DIAGNOSIS — R600 Localized edema: Secondary | ICD-10-CM | POA: Diagnosis not present

## 2017-06-06 DIAGNOSIS — I5043 Acute on chronic combined systolic (congestive) and diastolic (congestive) heart failure: Secondary | ICD-10-CM | POA: Diagnosis not present

## 2017-07-31 ENCOUNTER — Ambulatory Visit (INDEPENDENT_AMBULATORY_CARE_PROVIDER_SITE_OTHER): Payer: Medicare Other | Admitting: Cardiovascular Disease

## 2017-07-31 ENCOUNTER — Encounter: Payer: Self-pay | Admitting: Cardiovascular Disease

## 2017-07-31 VITALS — BP 130/82 | HR 82 | Ht 60.6 in | Wt 139.2 lb

## 2017-07-31 DIAGNOSIS — I5042 Chronic combined systolic (congestive) and diastolic (congestive) heart failure: Secondary | ICD-10-CM

## 2017-07-31 MED ORDER — METOPROLOL SUCCINATE ER 50 MG PO TB24: 50.0000 mg | ORAL_TABLET | Freq: Every day | ORAL | 3 refills | Status: AC

## 2017-07-31 NOTE — Patient Instructions (Signed)
Medication Instructions:  Your physician recommends that you continue on your current medications as directed. Please refer to the Current Medication list given to you today.   Labwork: TODAY - basic metabolic panel   Testing/Procedures: None Ordered   Follow-Up: Your physician recommends that you schedule a follow-up appointment in: 3-4 months with Dr. Acie Fredrickson   If you need a refill on your cardiac medications before your next appointment, please call your pharmacy.   Thank you for choosing [facility]! Christen Bame, RN [phone]

## 2017-07-31 NOTE — Progress Notes (Signed)
Cardiology Office Note:    Date:  07/31/2017   ID:  Evelyn Riley, DOB 08-31-1945, MRN [id number]  PCP:  Billie Ruddy, MD  Cardiologist:  Mertie Moores, MD   Referring MD: Billie Ruddy, MD   Chief Complaint  Patient presents with  . Congestive Heart Failure    May 01, 2017    Evelyn Riley is a 72 y.o. female with a hx of metastatic breast cancer and a new diagnosis of congestive heart failure.  I met her in the hospital several weeks ago.  She does not want to have any therapy or treatment or diagnosis of her breast cancer.  She was hospitalized with sepsis last week - secondary to pneumonia . Was at [facility]  She is on ARB,  metoprolol, Aldactone .   Doing well , tolerating the meds well .  Is losing weight  Will be seeing Grier Mitts, MD for primary   July 31, 2017:  Evelyn Riley is seen with daughter  , Evelyn Riley   Breathing is good , no CP  Able to do all her normal activities  Has not taken metoprolol for the past 2 days  ( ran out of meds,  Had a death in the family and was not able to call )  No leg swelling     Past Medical History:  Diagnosis Date  . Cancer ([place])   . Hypertension     History reviewed. No pertinent surgical history.  Current Medications: Current Meds  Medication Sig  . ASPIRIN ADULT LOW STRENGTH 81 MG EC tablet TAKE 1 TABLET BY MOUTH EVERY DAY  . furosemide (LASIX) 40 MG tablet Take 1 tablet (40 mg total) by mouth daily.  . irbesartan (AVAPRO) 75 MG tablet Take 1 tablet (75 mg total) by mouth daily.  . potassium chloride (K-DUR,KLOR-CON) 10 MEQ tablet Take 1 tablet (10 mEq total) by mouth 2 (two) times daily.  Marland Kitchen spironolactone (ALDACTONE) 25 MG tablet Take 0.5 tablets (12.5 mg total) by mouth daily.  . [DISCONTINUED] metoprolol succinate (TOPROL XL) 50 MG 24 hr tablet Take 1 tablet (50 mg total) by mouth daily. Take with or immediately following a meal.     Allergies:   Patient has no known allergies.   Social History    Socioeconomic History  . Marital status: Divorced    Spouse name: Not on file  . Number of children: Not on file  . Years of education: Not on file  . Highest education level: Not on file  Occupational History  . Not on file  Social Needs  . Financial resource strain: Not on file  . Food insecurity:    Worry: Not on file    Inability: Not on file  . Transportation needs:    Medical: Not on file    Non-medical: Not on file  Tobacco Use  . Smoking status: Never Smoker  . Smokeless tobacco: Never Used  Substance and Sexual Activity  . Alcohol use: No  . Drug use: No  . Sexual activity: Not on file  Lifestyle  . Physical activity:    Days per week: Not on file    Minutes per session: Not on file  . Stress: Not on file  Relationships  . Social connections:    Talks on phone: Not on file    Gets together: Not on file    Attends religious service: Not on file    Active member of club or organization: Not on file  Attends meetings of clubs or organizations: Not on file    Relationship status: Not on file  Other Topics Concern  . Not on file  Social History Narrative  . Not on file     Family History: The patient's family history includes Heart disease (age of onset: 55) in her father; Hypertension in her mother.  ROS:   Please see the history of present illness.     All other systems reviewed and are negative.  EKGs/Labs/Other Studies Reviewed:    The following studies were reviewed today:   EKG:    Recent Labs: 04/04/2017: B Natriuretic Peptide 1,060.2 04/22/2017: ALT 20; Hemoglobin 13.7; Platelets 172 04/23/2017: BUN 16; Creatinine, Ser 0.74; Magnesium 2.1; Potassium 4.4; Sodium 135  Recent Lipid Panel No results found for: CHOL, TRIG, HDL, CHOLHDL, VLDL, LDLCALC, LDLDIRECT  Physical Exam:    VS:  BP 130/82   Pulse 82   Ht 5' 0.6" (1.539 m)   Wt 139 lb 3.2 oz (63.1 kg)   SpO2 97%   BMI 26.65 kg/m     Wt Readings from Last 3 Encounters:  07/31/17  139 lb 3.2 oz (63.1 kg)  05/05/17 126 lb (57.2 kg)  05/01/17 132 lb 6.4 oz (60.1 kg)     GEN:  Well nourished, well developed in no acute distress HEENT: Normal NECK: No JVD; No carotid bruits LYMPHATICS: No lymphadenopathy CARDIAC: RRR, no murmurs, rubs, gallops,  Right breast mass , extending to R axillary region  Breast:   Right breast :   Large mass,  Obvious cancer around nipple, nipple inversion,   Axillary mass  RESPIRATORY:  Clear to auscultation without rales, wheezing or rhonchi  ABDOMEN: Soft, non-tender, non-distended MUSCULOSKELETAL:  No edema; No deformity  SKIN: Warm and dry NEUROLOGIC:  Alert and oriented x 3 PSYCHIATRIC:  Normal affect   ASSESSMENT:    1. Chronic combined systolic and diastolic congestive heart failure ([place])    PLAN:     1.  Chronic combined systolic and diastolic congestive heart failure:   Symptomatically she seems to be doing quite well. Been sick with significant improvement.  Her diet is good.  She is actually gained a little bit of weight.  She does not have any signs or symptoms of worsening congestive heart failure.  Continue current medications. Clinically she has improved.   Suggested we repeat echo to assess her LV functoin.   She wants to wait and consider it at a later time  I will see her again in 3 months.  2.  Metastatic breast cancer:   She does not want any formal diagnosis or treatment  Eating well , has gained some weight. Mass may have increased slightly.   Difficult to assess Again refused any formal treatment or diagnosis    Medication Adjustments/Labs and Tests Ordered: Current medicines are reviewed at length with the patient today.  Concerns regarding medicines are outlined above.  Orders Placed This Encounter  Procedures  . Basic Metabolic Panel (BMET)   Meds ordered this encounter  Medications  . metoprolol succinate (TOPROL XL) 50 MG 24 hr tablet    Sig: Take 1 tablet (50 mg total) by mouth daily. Take with  or immediately following a meal.    Dispense:  90 tablet    Refill:  3    Signed, Mertie Moores, MD  07/31/2017 8:39 PM    [place]

## 2017-08-01 LAB — BASIC METABOLIC PANEL
BUN / CREAT RATIO: 38 — AB (ref 12–28)
BUN: 32 mg/dL — ABNORMAL HIGH (ref 8–27)
CALCIUM: 9.7 mg/dL (ref 8.7–10.3)
CO2: 25 mmol/L (ref 20–29)
Chloride: 101 mmol/L (ref 96–106)
Creatinine, Ser: 0.84 mg/dL (ref 0.57–1.00)
GFR, EST AFRICAN AMERICAN: 81 mL/min/{1.73_m2} (ref >59)
GFR, EST NON AFRICAN AMERICAN: 70 mL/min/{1.73_m2} (ref >59)
Glucose: 178 mg/dL — ABNORMAL HIGH (ref 65–99)
Potassium: 4.6 mmol/L (ref 3.5–5.2)
SODIUM: 143 mmol/L (ref 134–144)

## 2017-11-15 ENCOUNTER — Encounter: Payer: Self-pay | Admitting: Cardiovascular Disease

## 2017-11-15 ENCOUNTER — Ambulatory Visit (INDEPENDENT_AMBULATORY_CARE_PROVIDER_SITE_OTHER): Payer: Medicare Other | Admitting: Cardiovascular Disease

## 2017-11-15 VITALS — BP 134/62 | HR 68 | Ht 60.6 in | Wt 147.0 lb

## 2017-11-15 DIAGNOSIS — C50919 Malignant neoplasm of unspecified site of unspecified female breast: Secondary | ICD-10-CM | POA: Diagnosis not present

## 2017-11-15 DIAGNOSIS — I5022 Chronic systolic (congestive) heart failure: Secondary | ICD-10-CM

## 2017-11-15 NOTE — Patient Instructions (Signed)
Medication Instructions:  Your physician recommends that you continue on your current medications as directed. Please refer to the Current Medication list given to you today.   Labwork: None Ordered   Testing/Procedures: Your physician has requested that you have an echocardiogram in 3 months on same day as appointment with Dr. Acie Fredrickson. Echocardiography is a painless test that uses sound waves to create images of your heart. It provides your doctor with information about the size and shape of your heart and how well your heart's chambers and valves are working. This procedure takes approximately one hour. There are no restrictions for this procedure.   Follow-Up: Your physician recommends that you schedule a follow-up appointment in: 3 months with Dr. Acie Fredrickson   If you need a refill on your cardiac medications before your next appointment, please call your pharmacy.   Thank you for choosing [facility]! Christen Bame, RN [phone]

## 2017-11-15 NOTE — Progress Notes (Signed)
Cardiology Office Note:    Date:  11/15/2017   ID:  Evelyn Riley, DOB 1945/04/24, MRN [id number]  PCP:  Billie Ruddy, MD  Cardiologist:  Mertie Moores, MD   Referring MD: Billie Ruddy, MD   Chief Complaint  Patient presents with  . Congestive Heart Failure    May 01, 2017    Evelyn Riley is a 72 y.o. female with a hx of metastatic breast cancer and a new diagnosis of congestive heart failure.  I met her in the hospital several weeks ago.  She does not want to have any therapy or treatment or diagnosis of her breast cancer.  She was hospitalized with sepsis last week - secondary to pneumonia . Was at [facility]  She is on ARB,  metoprolol, Aldactone .   Doing well , tolerating the meds well .  Is losing weight  Will be seeing Grier Mitts, MD for primary   July 31, 2017:  Evelyn Riley is seen with daughter  , Evelyn Riley   Breathing is good , no CP  Able to do all her normal activities  Has not taken metoprolol for the past 2 days  ( ran out of meds,  Had a death in the family and was not able to call )  No leg swelling   November 15, 2017: Seen today for follow-up of her congestive heart failure.  Her weight today is 147 pounds.  This is 8 pounds heavier than when I saw her in June. Breathing is good  Various aches and pains  No PND or orthophea,   Does regular activities with out issues  Is moving to [place] in several months   Past Medical History:  Diagnosis Date  . Cancer ([place])   . Hypertension     History reviewed. No pertinent surgical history.  Current Medications: Current Meds  Medication Sig  . ASPIRIN ADULT LOW STRENGTH 81 MG EC tablet TAKE 1 TABLET BY MOUTH EVERY DAY  . furosemide (LASIX) 40 MG tablet Take 1 tablet (40 mg total) by mouth daily.  . irbesartan (AVAPRO) 75 MG tablet Take 1 tablet (75 mg total) by mouth daily.  . metoprolol succinate (TOPROL XL) 50 MG 24 hr tablet Take 1 tablet (50 mg total) by mouth daily. Take with or  immediately following a meal.  . potassium chloride (K-DUR,KLOR-CON) 10 MEQ tablet Take 1 tablet (10 mEq total) by mouth 2 (two) times daily.  Marland Kitchen spironolactone (ALDACTONE) 25 MG tablet Take 0.5 tablets (12.5 mg total) by mouth daily.     Allergies:   Patient has no known allergies.   Social History   Socioeconomic History  . Marital status: Divorced    Spouse name: Not on file  . Number of children: Not on file  . Years of education: Not on file  . Highest education level: Not on file  Occupational History  . Not on file  Social Needs  . Financial resource strain: Not on file  . Food insecurity:    Worry: Not on file    Inability: Not on file  . Transportation needs:    Medical: Not on file    Non-medical: Not on file  Tobacco Use  . Smoking status: Never Smoker  . Smokeless tobacco: Never Used  Substance and Sexual Activity  . Alcohol use: No  . Drug use: No  . Sexual activity: Not on file  Lifestyle  . Physical activity:    Days per week: Not on file  Minutes per session: Not on file  . Stress: Not on file  Relationships  . Social connections:    Talks on phone: Not on file    Gets together: Not on file    Attends religious service: Not on file    Active member of club or organization: Not on file    Attends meetings of clubs or organizations: Not on file    Relationship status: Not on file  Other Topics Concern  . Not on file  Social History Narrative  . Not on file     Family History: The patient's family history includes Heart disease (age of onset: 33) in her father; Hypertension in her mother.  ROS:   Please see the history of present illness.     All other systems reviewed and are negative.  EKGs/Labs/Other Studies Reviewed:    The following studies were reviewed today:   EKG:    Recent Labs: 04/04/2017: B Natriuretic Peptide 1,060.2 04/22/2017: ALT 20; Hemoglobin 13.7; Platelets 172 04/23/2017: Magnesium 2.1 07/31/2017: BUN 32; Creatinine,  Ser 0.84; Potassium 4.6; Sodium 143  Recent Lipid Panel No results found for: CHOL, TRIG, HDL, CHOLHDL, VLDL, LDLCALC, LDLDIRECT  Physical Exam:     Physical Exam: Blood pressure 134/62, pulse 68, height 5' 0.6" (1.539 m), weight 147 lb (66.7 kg), SpO2 95 %.  GEN:   Chronically ill appearing  HEENT: Normal NECK: No JVD; No carotid bruits LYMPHATICS: No lymphadenopathy Breast:   Cancerous right breast, she is getting some cutaneous spread of her breast cancer.  Her mass in her breast is gradually enlarging.    It extends towards her axilla .  Nipple inversion.   CARDIAC: RRR   RESPIRATORY:  Clear to auscultation without rales, wheezing or rhonchi  ABDOMEN: Soft, non-tender, non-distended MUSCULOSKELETAL:  No edema; No deformity  SKIN: Warm and dry NEUROLOGIC:  Alert and oriented x 3     ASSESSMENT:    No diagnosis found. PLAN:     1.  Chronic combined systolic and diastolic congestive heart failure:   She is on a beta-blocker and ARB.  We will get an echocardiogram for further assessment of her left ventricular function.  Ejection fraction was 20 to 25% when she was first diagnosed in February, 2019.  3 months.  We will get the echocardiogram the same day as her office visit.    2.  Metastatic breast cancer:   She does not want any formal diagnosis or treatment   she asked what her prognosis was I informed her that I really could not tell her because we did not have enough information.    Medication Adjustments/Labs and Tests Ordered: Current medicines are reviewed at length with the patient today.  Concerns regarding medicines are outlined above.  No orders of the defined types were placed in this encounter.  No orders of the defined types were placed in this encounter.   Signed, Mertie Moores, MD  11/15/2017 4:43 PM    [place]

## 2017-12-08 ENCOUNTER — Encounter: Payer: Self-pay | Admitting: Cardiovascular Disease

## 2017-12-20 ENCOUNTER — Telehealth: Payer: Self-pay

## 2017-12-20 NOTE — Telephone Encounter (Signed)
New message    Just an FYI. We have made several attempts to contact this patient including sending a letter to schedule or reschedule their echocardiogram. We will be removing the patient from the echo WQ.   Thank you 

## 2018-03-12 ENCOUNTER — Ambulatory Visit
Admit: 2018-03-12 | Discharge: 2018-03-12 | Payer: MEDICARE | Attending: Student in an Organized Health Care Education/Training Program | Primary: Internal Medicine

## 2018-03-12 DIAGNOSIS — I5022 Chronic systolic (congestive) heart failure: Secondary | ICD-10-CM

## 2018-03-12 DIAGNOSIS — Z6827 Body mass index (BMI) 27.0-27.9, adult: Secondary | ICD-10-CM | POA: Diagnosis not present

## 2018-03-12 DIAGNOSIS — C50919 Malignant neoplasm of unspecified site of unspecified female breast: Secondary | ICD-10-CM | POA: Diagnosis not present

## 2018-03-12 DIAGNOSIS — I420 Dilated cardiomyopathy: Secondary | ICD-10-CM | POA: Diagnosis not present

## 2018-03-12 DIAGNOSIS — I10 Essential (primary) hypertension: Secondary | ICD-10-CM | POA: Diagnosis not present

## 2018-03-12 MED ORDER — IRBESARTAN 75 MG TAB
75 mg | ORAL_TABLET | Freq: Every evening | ORAL | 1 refills | Status: DC
Start: 2018-03-12 — End: 2018-03-13

## 2018-03-12 MED ORDER — POTASSIUM CHLORIDE SR 10 MEQ TAB
10 mEq | ORAL_TABLET | Freq: Every day | ORAL | 1 refills | Status: DC
Start: 2018-03-12 — End: 2018-03-13

## 2018-03-12 MED ORDER — FUROSEMIDE 40 MG TAB
40 mg | ORAL_TABLET | Freq: Every day | ORAL | 1 refills | Status: DC
Start: 2018-03-12 — End: 2018-03-13

## 2018-03-12 MED ORDER — METOPROLOL SUCCINATE SR 50 MG 24 HR TAB
50 mg | ORAL_TABLET | Freq: Every day | ORAL | 1 refills | Status: DC
Start: 2018-03-12 — End: 2018-03-13

## 2018-03-12 MED ORDER — SPIRONOLACTONE 25 MG TAB
25 mg | ORAL_TABLET | Freq: Every day | ORAL | 1 refills | Status: DC
Start: 2018-03-12 — End: 2018-03-13

## 2018-03-12 NOTE — Progress Notes (Addendum)
[facility]  [address], [address]  [place], [place] [address]    Office [phone]           Jenny [place] is a 73 y.o. 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  NYHA class 2 symptoms  - cont spiro 12.5mg  daily  - cont irbesartan 75mg  daily  - cont metoprolol XL 50mg  daily  - cont lasix 40mg  daily  - cont KCl  - repeat echo in 3 months  - obtain records from Jenny Carina, MD cardiology and [place]  - recommend obtaining BMP with labs by Dr. Su Grand on Friday.        Discussed role of ICD therapy for treatment of sudden cardiac death, she deferred on implantation if LVEF remains depressed      Breast cancer- metastatic, deferred on biopsy and treatment      Addendum:  [facility] records  LVEF 20-25%, akinesis of anterior septal, anterior and anterolateral walls.  Aortic calcification w/o stenosis    Primary Care Physician- Dr. Victorino Dike Simpliciano    Follow-up 3 months    Subjective:  73 y.o. presents for initial evaluation.  She recently moved to the [place] area from [place], [place].  She was admitted last winter/spring for acute decompensated heart failure.  At that time she was told that her heart function was 25%.  She was started on medical therapy.  It does not appear that she had cardiac catheterization performed.  She also reports that she was found to have masses in her breasts and lymph nodes, she declined biopsies and treatment for probable carcinoma of the breast.  She was stabilized on medical regimen for HF.  No recurrent [place] admissions since last year.  Continues with mild exertional dyspnea.  No chest pain, orthopnea, pnd.      Medical hx   Breast cancer, metastatic  Cardiomyopathy  Hypertension      Current Outpatient Medications:    ???  aspirin delayed-release 81 mg tablet, Take  by mouth daily., Disp: , Rfl:   ???  spironolactone (ALDACTONE) 25 mg tablet, Take 0.5 Tabs by mouth daily., Disp: 45 Tab, Rfl: 1  ???  furosemide (LASIX) 40 mg tablet, Take 1 Tab by mouth daily., Disp: 90 Tab, Rfl: 1  ???  irbesartan (AVAPRO) 75 mg tablet, Take 1 Tab by mouth nightly., Disp: 90 Tab, Rfl: 1  ???  metoprolol succinate (TOPROL-XL) 50 mg XL tablet, Take 1 Tab by mouth daily., Disp: 90 Tab, Rfl: 1  ???  potassium chloride SR (KLOR-CON 10) 10 mEq tablet, Take 1 Tab by mouth daily., Disp: 90 Tab, Rfl: 1    Allergies not on file     No family history on file.    Social History     Tobacco Use   ??? Smoking status: Not on file   Substance Use Topics   ??? Alcohol use: Not on file   ??? Drug use: Not on file       Review of Symptoms:  Pertinent Positive: neagtive  Pertinent Negative: No chest pain, dyspnea on exertion, shortness of breath, orthopnea, PND    All Other systems reviewed and are negative for a Comprehensive ROS (10+)    Physical Exam    Blood pressure 124/80, pulse (!) 55, height 5\' 1"  (1.549 m), weight 145 lb (65.8 kg), SpO2 97 %.  Constitutional:  well-developed and well-nourished. No distress.  HENT: Normocephalic.   Eyes: No scleral icterus.   Neck:  Neck supple. No JVD present.   Pulmonary/Chest: Effort normal and breath sounds normal. No respiratory distress, wheezes or rales.  Cardiovascular: Normal rate, regular rhythm, S1 S2 . Exam reveals no gallop and no friction rub.  No murmur heard.  No edema.  Extremities:  Normal muscle tone  Abdominal:   No abnormal distension.   Neurological:  Moving all extremities, cranial nerves appear grossly intact.  Skin: Skin is not cold.  Not diaphoretic. No erythema.   Psychiatric:  Grossly normal mood and affect.  Intact insight.    Objective Data:    ECG: 03/12/2018- sinus rhythm, anterior q waves                Clint Lipps, DO

## 2018-03-12 NOTE — Progress Notes (Signed)
Jenny Rivera is a 73 y.o. female    Chief Complaint   Patient presents with   ??? CHF       Chest pain no  SOB no  Dizziness no  Swelling no  Recent hospital visit no  Refills no  Visit Vitals  BP 124/80 (BP 1 Location: Left arm, BP Patient Position: Sitting)   Pulse (!) 55   Ht 5\' 1"  (1.549 m)   Wt 145 lb (65.8 kg)   SpO2 97%   BMI 27.40 kg/m??

## 2018-03-12 NOTE — Telephone Encounter (Signed)
[facility] Pharmacist called to say they have received prescriptions for a patient that doesn't come to that particular pharmacist. Might be the [address] pharmacist instead of the [facility] Pharmacist.Please advise    Phone:[phone]

## 2018-03-13 MED ORDER — FUROSEMIDE 40 MG TAB
40 mg | ORAL_TABLET | Freq: Every day | ORAL | 1 refills | Status: AC
Start: 2018-03-13 — End: ?

## 2018-03-13 MED ORDER — POTASSIUM CHLORIDE SR 10 MEQ TAB
10 mEq | ORAL_TABLET | Freq: Every day | ORAL | 1 refills | Status: DC
Start: 2018-03-13 — End: 2018-06-06

## 2018-03-13 MED ORDER — SPIRONOLACTONE 25 MG TAB
25 mg | ORAL_TABLET | Freq: Every day | ORAL | 1 refills | Status: AC
Start: 2018-03-13 — End: ?

## 2018-03-13 MED ORDER — IRBESARTAN 75 MG TAB
75 mg | ORAL_TABLET | Freq: Every evening | ORAL | 1 refills | Status: AC
Start: 2018-03-13 — End: ?

## 2018-03-13 MED ORDER — METOPROLOL SUCCINATE SR 50 MG 24 HR TAB
50 mg | ORAL_TABLET | Freq: Every day | ORAL | 1 refills | Status: AC
Start: 2018-03-13 — End: ?

## 2018-03-13 NOTE — Telephone Encounter (Signed)
Patient's daughter Toniann Fail called [phone] she wanted to let us know the Potassium Chloride 10 mEq should be (2) Tablets daily the prescription reads one tablet daily.  It was originally prescribed from her Cardiologist in .    Please advise.

## 2018-03-13 NOTE — Telephone Encounter (Signed)
Per Dr Barry Dienes it is fine we can change the Potassium to (2) Tablets daily as patient has been taking.    Requested Prescriptions     Pending Prescriptions Disp Refills   ??? potassium chloride SR (KLOR-CON 10) 10 mEq tablet 180 Tab 1     Sig: Take 2 Tabs by mouth daily.

## 2018-03-13 NOTE — Progress Notes (Signed)
Faxed medical records request to   Dr Leodis Sias, MD at   Fax # [phone]

## 2018-03-13 NOTE — Telephone Encounter (Signed)
Resent medications to [facility] on file.

## 2018-03-13 NOTE — Telephone Encounter (Signed)
Medications per VO Dr Barry Dienes    Requested Prescriptions     Pending Prescriptions Disp Refills   ??? spironolactone (ALDACTONE) 25 mg tablet 45 Tab 1     Sig: Take 0.5 Tabs by mouth daily.   ??? metoprolol succinate (TOPROL-XL) 50 mg XL tablet 90 Tab 1     Sig: Take 1 Tab by mouth daily.   ??? irbesartan (AVAPRO) 75 mg tablet 90 Tab 1     Sig: Take 1 Tab by mouth nightly.   ??? furosemide (LASIX) 40 mg tablet 90 Tab 1     Sig: Take 1 Tab by mouth daily.

## 2018-03-16 ENCOUNTER — Inpatient Hospital Stay: Admit: 2018-03-16 | Payer: MEDICARE | Primary: Internal Medicine

## 2018-03-16 ENCOUNTER — Ambulatory Visit: Admit: 2018-03-16 | Discharge: 2018-03-16 | Payer: MEDICARE | Attending: Internal Medicine | Primary: Internal Medicine

## 2018-03-16 DIAGNOSIS — Z0000 Encounter for general adult medical examination without abnormal findings: Secondary | ICD-10-CM

## 2018-03-16 DIAGNOSIS — Z6827 Body mass index (BMI) 27.0-27.9, adult: Secondary | ICD-10-CM | POA: Diagnosis not present

## 2018-03-16 DIAGNOSIS — I10 Essential (primary) hypertension: Secondary | ICD-10-CM | POA: Diagnosis not present

## 2018-03-16 DIAGNOSIS — I509 Heart failure, unspecified: Secondary | ICD-10-CM | POA: Diagnosis not present

## 2018-03-16 DIAGNOSIS — C50919 Malignant neoplasm of unspecified site of unspecified female breast: Secondary | ICD-10-CM | POA: Diagnosis not present

## 2018-03-16 LAB — METABOLIC PANEL, BASIC
Anion gap: 8 mmol/L (ref 5–15)
BUN/Creatinine ratio: 28 — ABNORMAL HIGH (ref 12–20)
BUN: 28 MG/DL — ABNORMAL HIGH (ref 6–20)
CO2: 27 mmol/L (ref 21–32)
Calcium: 9.4 MG/DL (ref 8.5–10.1)
Chloride: 103 mmol/L (ref 97–108)
Creatinine: 1 MG/DL (ref 0.55–1.02)
GFR est AA: >60 mL/min/{1.73_m2} (ref >60)
GFR est non-AA: 55 mL/min/{1.73_m2} — ABNORMAL LOW (ref >60)
Glucose: 174 mg/dL — ABNORMAL HIGH (ref 65–100)
Potassium: 4.1 mmol/L (ref 3.5–5.1)
Sodium: 138 mmol/L (ref 136–145)

## 2018-03-16 NOTE — Patient Instructions (Signed)
Medicare Wellness Visit, Female     The best way to live healthy is to have a lifestyle where you eat a well-balanced diet, exercise regularly, limit alcohol use, and quit all forms of tobacco/nicotine, if applicable.     Regular preventive services are another way to keep healthy. Preventive services (vaccines, screening tests, monitoring & exams) can help personalize your care plan, which helps you manage your own care. Screening tests can find health problems at the earliest stages, when they are easiest to treat.   [facility] follows the current, evidence-based guidelines published by the [employer] [employer] (USPSTF) when recommending preventive services for our patients. Because we follow these guidelines, sometimes recommendations change over time as research supports it. (For example, mammograms used to be recommended annually. Even though Medicare will still pay for an annual mammogram, the newer guidelines recommend a mammogram every two years for women of average risk).  Of course, you and your doctor may decide to screen more often for some diseases, based on your risk and your co-morbidities (chronic disease you are already diagnosed with).     Preventive services for you include:  - Medicare offers their members a free annual wellness visit, which is time for you and your primary care provider to discuss and plan for your preventive service needs. Take advantage of this benefit every year!  -All adults over the age of 49 should receive the recommended pneumonia vaccines. Current USPSTF guidelines recommend a series of two vaccines for the best pneumonia protection.   -All adults should have a flu vaccine yearly and a tetanus vaccine every 10 years.   -All adults age 28 and older should receive the shingles vaccines (series of two vaccines).      -All adults age 89-70 who are overweight should have a diabetes screening test once every three years.    -All adults born between [birth year] and 1964 should be screened once for Hepatitis C.  -Other screening tests and preventive services for persons with diabetes include: an eye exam to screen for diabetic retinopathy, a kidney function test, a foot exam, and stricter control over your cholesterol.   -Cardiovascular screening for adults with routine risk involves an electrocardiogram (ECG) at intervals determined by your doctor.   -Colorectal cancer screenings should be done for adults age 26-75 with no increased risk factors for colorectal cancer.  There are a number of acceptable methods of screening for this type of cancer. Each test has its own benefits and drawbacks. Discuss with your doctor what is most appropriate for you during your annual wellness visit. The different tests include: colonoscopy (considered the best screening method), a fecal occult blood test, a fecal DNA test, and sigmoidoscopy.    -A bone mass density test is recommended when a woman turns 65 to screen for osteoporosis. This test is only recommended one time, as a screening. Some providers will use this same test as a disease monitoring tool if you already have osteoporosis.  -Breast cancer screenings are recommended every other year for women of normal risk, age 43-74.  -Cervical cancer screenings for women over age 37 are only recommended with certain risk factors.     Here is a list of your current Health Maintenance items (your personalized list of preventive services) with a due date:  Health Maintenance Due   Topic Date Due   ??? Hepatitis C Test  Nov 07, 1945   ??? DTaP/Tdap/Td  (1 - Tdap) 03/20/1957   ???  Shingles Vaccine (1 of 2) 03/20/1996   ??? Mammogram  03/20/1996   ??? Stool testing for trace blood  03/20/1996   ??? Glaucoma Screening   03/21/2011   ??? Bone Mineral Density   03/21/2011   ??? Pneumococcal Vaccine (1 of 1 - PPSV23) 03/21/2011   ??? Flu Vaccine  09/14/2017   ??? Annual Well Visit  02/15/2018

## 2018-03-16 NOTE — Progress Notes (Signed)
Assessment and Plan   Diagnoses and all orders for this visit:    1. Medicare annual wellness visit, subsequent  Paperwork given for [place] advanced directive.  Patient reports that she is DNR.  Discussed getting the shingles vaccine.  DEXA declined.  Discussed risk of osteoporosis.  Pneumonia vaccine declined    2. Congestive heart failure, unspecified HF chronicity, unspecified heart failure type (HCC)  -     METABOLIC PANEL, BASIC; Future  4. Essential hypertension  Followed by cardiology.  Continue medications    3. Malignant neoplasm of female breast, unspecified estrogen receptor status, unspecified laterality, unspecified site of breast (HCC)  Not biopsy-proven.  Was told about a year ago that she had metastatic cancer by imaging.  Not interested in further work-up at this time as she wants to enjoy the rest of the time that she has.  Offered an oncology referral if she ever wanted it.    Return to clinic: 6 months for general follow-up    Su GrandJennifer Gemayel Mascio, MD  [facility]  03/16/2018    Future Appointments   Date Time Provider Department Center   06/13/2018 10:00 AM [place] [facility]   06/13/2018 11:20 AM Candyce Churnwens, John T, DO [facility]        History of Present Illness   Chief Complaint   Establish care    Jenny PearsonCarol Rivera is a 73 y.o. female     [place] and is currently living with her son and daughter.  Accompanied by her daughter to this visit.    Congestive heart failure???followed by Dr. Barry Dieneswens.  Currently on medications.  Diagnosed about a year ago.  Currently denies any chest pain, shortness of breath, orthopnea, lower extremity swelling, paroxysmal nocturnal dyspnea, lightheadedness, dizziness.    Metastatic breast cancer??? diagnosed during her hospitalization when she was diagnosed congestive heart failure.  Reports that she had imaging at that time which showed metastatic cancer.  Declined biopsy at that time.   Not interested in further work-up at this time.    Overweight??? eating well    Review of Systems   Constitutional: Negative for chills and fever.   HENT: Negative for hearing loss.    Eyes: Positive for blurred vision.   Respiratory: Negative for shortness of breath.    Cardiovascular: Negative for chest pain.   Gastrointestinal: Negative for abdominal pain, blood in stool, constipation, diarrhea, melena, nausea and vomiting.   Genitourinary: Negative for dysuria and hematuria.   Musculoskeletal: Negative for joint pain.   Skin: Negative for rash.   Neurological: Negative for headaches.        Past Medical History   No Known Allergies     Current Outpatient Medications   Medication Sig   ??? spironolactone (ALDACTONE) 25 mg tablet Take 0.5 Tabs by mouth daily.   ??? metoprolol succinate (TOPROL-XL) 50 mg XL tablet Take 1 Tab by mouth daily.   ??? irbesartan (AVAPRO) 75 mg tablet Take 1 Tab by mouth nightly.   ??? furosemide (LASIX) 40 mg tablet Take 1 Tab by mouth daily.   ??? potassium chloride SR (KLOR-CON 10) 10 mEq tablet Take 2 Tabs by mouth daily.   ??? aspirin delayed-release 81 mg tablet Take  by mouth daily.     No current facility-administered medications for this visit.           Patient Active Problem List   Diagnosis Code   ??? Chronic systolic congestive heart failure (HCC)  I50.22   ??? Dilated cardiomyopathy (HCC) I42.0   ??? Essential hypertension I10   ??? Malignant neoplasm of female breast (HCC) C50.919     History reviewed. No pertinent surgical history.   Social History     Tobacco Use   ??? Smoking status: Never Smoker   ??? Smokeless tobacco: Never Used   Substance Use Topics   ??? Alcohol use: Not Currently      Family History   Problem Relation Age of Onset   ??? Hypertension Mother    ??? Heart Disease Father         Physical Exam   Vitals:       Visit Vitals  BP 137/81 (BP 1 Location: Left arm, BP Patient Position: Sitting)   Pulse 61   Temp 98.6 ??F (37 ??C) (Oral)   Resp 18   Ht 5\' 1"  (1.549 m)    Wt 144 lb 8 oz (65.5 kg)   SpO2 99%   BMI 27.30 kg/m??        Physical Exam  Constitutional:       General: She is not in acute distress.     Appearance: She is well-developed.   HENT:      Right Ear: There is impacted cerumen.      Left Ear: Tympanic membrane, ear canal and external ear normal.      Nose: Nose normal.      Mouth/Throat:      Mouth: Mucous membranes are moist.      Pharynx: No posterior oropharyngeal erythema.   Eyes:      Conjunctiva/sclera: Conjunctivae normal.      Pupils: Pupils are equal, round, and reactive to light.   Neck:      Musculoskeletal: Neck supple.   Cardiovascular:      Rate and Rhythm: Normal rate and regular rhythm.      Pulses: Normal pulses.      Heart sounds: No murmur. No friction rub. No gallop.    Pulmonary:      Effort: No respiratory distress.      Breath sounds: No wheezing or rales.   Abdominal:      General: Bowel sounds are normal. There is no distension.      Palpations: Abdomen is soft. There is no hepatomegaly, splenomegaly or mass.      Tenderness: There is no abdominal tenderness.      Hernia: No hernia is present.   Skin:     General: Skin is warm.      Findings: No rash.   Neurological:      Mental Status: She is alert.   Psychiatric:         Mood and Affect: Mood normal.         Thought Content: Thought content normal.         Judgment: Judgment normal.          Voice recognition software is utilized and this note may contain transcription errors    This is the Subsequent Medicare Annual Wellness Exam, performed 12 months or more after the Initial AWV or the last Subsequent AWV    I have reviewed the patient's medical history in detail and updated the computerized patient record.     History     Patient Active Problem List   Diagnosis Code   ??? Chronic systolic congestive heart failure (HCC) I50.22   ??? Dilated cardiomyopathy (HCC) I42.0   ??? Essential hypertension I10   ??? Malignant neoplasm of  female breast (HCC) C50.919      History reviewed. No pertinent past medical history.   History reviewed. No pertinent surgical history.  Current Outpatient Medications   Medication Sig Dispense Refill   ??? spironolactone (ALDACTONE) 25 mg tablet Take 0.5 Tabs by mouth daily. 45 Tab 1   ??? metoprolol succinate (TOPROL-XL) 50 mg XL tablet Take 1 Tab by mouth daily. 90 Tab 1   ??? irbesartan (AVAPRO) 75 mg tablet Take 1 Tab by mouth nightly. 90 Tab 1   ??? furosemide (LASIX) 40 mg tablet Take 1 Tab by mouth daily. 90 Tab 1   ??? potassium chloride SR (KLOR-CON 10) 10 mEq tablet Take 2 Tabs by mouth daily. 180 Tab 1   ??? aspirin delayed-release 81 mg tablet Take  by mouth daily.       No Known Allergies    Family History   Problem Relation Age of Onset   ??? Hypertension Mother    ??? Heart Disease Father      Social History     Tobacco Use   ??? Smoking status: Never Smoker   ??? Smokeless tobacco: Never Used   Substance Use Topics   ??? Alcohol use: Not Currently       Depression Risk Factor Screening:   No flowsheet data found.    Alcohol Risk Factor Screening:   Do you average 1 drink per night or more than 7 drinks a week:  No    On any one occasion in the past three months have you have had more than 3 drinks containing alcohol:  No      Functional Ability and Level of Safety:   Hearing: Hearing is good.    Activities of Daily Living:  The home contains: rugs and discussed risk of rugs  Patient does total self care    Ambulation: with no difficulty    Fall Risk:  No flowsheet data found.    Abuse Screen:  Patient is not abused    Cognitive Screening   Has your family/caregiver stated any concerns about your memory: no    Patient Care Team   No care team member to display    Assessment/Plan   Education and counseling provided:  Are appropriate based on today's review and evaluation    Diagnoses and all orders for this visit:    1. Medicare annual wellness visit, subsequent    2. Congestive heart failure, unspecified HF chronicity, unspecified heart  failure type (HCC)  -     METABOLIC PANEL, BASIC; Future        Health Maintenance Due   Topic Date Due   ??? Hepatitis C Screening  [birth year]   ??? DTaP/Tdap/Td series (1 - Tdap) 03/20/1957   ??? Shingrix Vaccine Age 32> (1 of 2) 03/20/1996   ??? BREAST CANCER SCRN MAMMOGRAM  03/20/1996   ??? FOBT Q 1 YEAR AGE 36-75  03/20/1996   ??? GLAUCOMA SCREENING Q2Y  03/21/2011   ??? Bone Densitometry (Dexa) Screening  03/21/2011   ??? Pneumococcal 65+ years (1 of 1 - PPSV23) 03/21/2011   ??? Influenza Age 82 to Adult  09/14/2017   ??? MEDICARE YEARLY EXAM  02/15/2018

## 2018-03-19 NOTE — Progress Notes (Signed)
Labs reviewed and normal.  Letter sent.

## 2018-04-28 ENCOUNTER — Other Ambulatory Visit: Payer: Self-pay | Admitting: Physician Assistant

## 2018-06-04 ENCOUNTER — Telehealth: Admit: 2018-06-04 | Payer: MEDICARE | Attending: Internal Medicine | Primary: Internal Medicine

## 2018-06-04 DIAGNOSIS — R5383 Other fatigue: Secondary | ICD-10-CM

## 2018-06-04 DIAGNOSIS — C50919 Malignant neoplasm of unspecified site of unspecified female breast: Secondary | ICD-10-CM | POA: Diagnosis not present

## 2018-06-04 DIAGNOSIS — I5022 Chronic systolic (congestive) heart failure: Secondary | ICD-10-CM | POA: Diagnosis not present

## 2018-06-04 MED ORDER — ONDANSETRON HCL 4 MG TAB
4 mg | ORAL_TABLET | Freq: Three times a day (TID) | ORAL | 1 refills | Status: DC | PRN
Start: 2018-06-04 — End: 2018-06-08

## 2018-06-04 NOTE — Progress Notes (Signed)
Consent: Jenny Rivera, who was seen by synchronous (real-time) audio-video technology, and/or her healthcare decision maker, is aware that this patient-initiated, Telehealth encounter on 06/04/2018 is a billable service, with coverage as determined by her insurance carrier. She is aware that she may receive a bill and has provided verbal consent to proceed: Yes.    I was in the office while conducting this encounter.    This visit was done with [email or web address]    Assessment and Plan   Diagnoses and all orders for this visit:    1. Fatigue, unspecified type  -     METABOLIC PANEL, COMPREHENSIVE; Future  -     CBC WITH AUTOMATED DIFF; Future  -     MAGNESIUM; Future  -     PHOSPHORUS; Future  -     NT-PRO BNP; Future  -     TSH 3RD GENERATION; Future  -     T4, FREE; Future  2. Chronic systolic (congestive) heart failure (HCC)   -     NT-PRO BNP; Future  Other orders  -     ondansetron hcl (ZOFRAN) 4 mg tablet; Take 1 Tab by mouth every eight (8) hours as needed for Nausea or Vomiting.  Unclear etiology of symptoms. No other signs of possible infection.  Will get labs to ensure electrolytes are all normal.  Doesn't have any other s/sx of CHF - gut edema causing anorexia?  Advised them to come in tomorrow for labs and a UA. [facility] records available for review that confirm likely metastatic breast cancer.  Symptoms possibly secondary to disease progression.  Family is interested in getting hospice involved.  Will place referral.      We discussed the expected course, resolution and complications of the diagnosis(es) in detail.  Medication risks, benefits, costs, interactions, and alternatives were discussed as indicated.  I advised her to contact the office if her condition worsens, changes or fails to improve as anticipated. She expressed understanding with the diagnosis(es) and plan.     Return to clinic:  Pending workup    Su Grand, MD  [facility]  06/04/2018     Future Appointments   Date Time Provider Department Center   08/06/2018  9:00 AM [place], [place] [facility]   08/06/2018 10:40 AM Candyce Churn, DO [facility] SCHED        Subjective   Chief Complaint   Fatigue, anorexia    Jenny Rivera is a 73 y.o. female     Reports feeling more fatigue and poor appetite for at least the last 2 weeks.  Also had a fall while going up the stairs.  Patient reports that she is tripped on the stairs.  No loss of consciousness.  Associated with some nausea.  Feels like symptoms started after she ate something that may have given her an upset stomach but symptoms have not improved.  Denies any respiratory symptoms, fever, chills, abdominal pain, diarrhea, constipation, urinary symptoms, headache, dizziness, lightheadedness.  Denies any orthopnea, paroxysmal nocturnal dyspnea, lower extremity swelling, shortness of breath.  Has been going through a lot of stress with the move from [place].  Reports that she has not been sleeping well, although that has been a chronic issue.  Daughter reports that she seems to be napping more.    Review of Systems   Constitutional: Negative for chills and fever.   Respiratory: Negative for shortness of breath.    Cardiovascular: Negative for chest pain.  Objective   Vitals:       There were no vitals taken for this visit.     Physical Exam  Constitutional:       Appearance: Normal appearance. She is not ill-appearing.   Pulmonary:      Effort: No respiratory distress.   Neurological:      Mental Status: She is alert.   Psychiatric:         Mood and Affect: Mood normal.         Thought Content: Thought content normal.         Judgment: Judgment normal.          Voice recognition software is utilized and this note may contain transcription errors     Jenny Rivera is a 73 y.o. female being evaluated by a video visit encounter for concerns as above.  A caregiver was present when appropriate. Due to  this being a Scientist, research (medical)TeleHealth encounter (During COVID-19 public health emergency), evaluation of the following organ systems was limited: Vitals/Constitutional/EENT/Resp/CV/GI/GU/MS/Neuro/Skin/Heme-Lymph-Imm.  Pursuant to the emergency declaration under the [facility] Act and the [employer] Emergencies Act, 1135 waiver authority and the [employer]avirus Preparedness and [employer] Supplemental Appropriations Act, this Virtual  Visit was conducted, with patient's (and/or legal guardian's) consent, to reduce the patient's risk of exposure to COVID-19 and provide necessary medical care.     Services were provided through a video synchronous discussion virtually to substitute for in-person clinic visit.

## 2018-06-05 ENCOUNTER — Inpatient Hospital Stay: Admit: 2018-06-05 | Primary: Internal Medicine

## 2018-06-05 LAB — TSH 3RD GENERATION: TSH: 1.31 u[IU]/mL (ref 0.36–3.74)

## 2018-06-05 LAB — T4, FREE: T4, Free: 1.2 NG/DL (ref 0.8–1.5)

## 2018-06-06 ENCOUNTER — Inpatient Hospital Stay
Admit: 2018-06-06 | Discharge: 2018-06-08 | Disposition: A | Discharge status: Hospice | Payer: MEDICARE | Attending: Family Medicine | Admitting: Family Medicine

## 2018-06-06 ENCOUNTER — Emergency Department: Admit: 2018-06-06 | Payer: MEDICARE | Primary: Internal Medicine

## 2018-06-06 DIAGNOSIS — N179 Acute kidney failure, unspecified: Principal | ICD-10-CM

## 2018-06-06 DIAGNOSIS — C50919 Malignant neoplasm of unspecified site of unspecified female breast: Secondary | ICD-10-CM | POA: Diagnosis present

## 2018-06-06 DIAGNOSIS — E871 Hypo-osmolality and hyponatremia: Secondary | ICD-10-CM | POA: Diagnosis not present

## 2018-06-06 DIAGNOSIS — R112 Nausea with vomiting, unspecified: Secondary | ICD-10-CM | POA: Diagnosis not present

## 2018-06-06 DIAGNOSIS — Z515 Encounter for palliative care: Secondary | ICD-10-CM | POA: Diagnosis not present

## 2018-06-06 DIAGNOSIS — C7951 Secondary malignant neoplasm of bone: Secondary | ICD-10-CM | POA: Diagnosis not present

## 2018-06-06 DIAGNOSIS — I5189 Other ill-defined heart diseases: Secondary | ICD-10-CM | POA: Diagnosis not present

## 2018-06-06 DIAGNOSIS — R627 Adult failure to thrive: Secondary | ICD-10-CM | POA: Diagnosis not present

## 2018-06-06 DIAGNOSIS — E872 Acidosis: Secondary | ICD-10-CM | POA: Diagnosis not present

## 2018-06-06 DIAGNOSIS — I5022 Chronic systolic (congestive) heart failure: Secondary | ICD-10-CM | POA: Diagnosis present

## 2018-06-06 DIAGNOSIS — R778 Other specified abnormalities of plasma proteins: Secondary | ICD-10-CM | POA: Diagnosis not present

## 2018-06-06 DIAGNOSIS — I340 Nonrheumatic mitral (valve) insufficiency: Secondary | ICD-10-CM | POA: Diagnosis not present

## 2018-06-06 DIAGNOSIS — I110 Hypertensive heart disease with heart failure: Secondary | ICD-10-CM | POA: Diagnosis not present

## 2018-06-06 DIAGNOSIS — R630 Anorexia: Secondary | ICD-10-CM | POA: Diagnosis not present

## 2018-06-06 DIAGNOSIS — E860 Dehydration: Secondary | ICD-10-CM | POA: Diagnosis present

## 2018-06-06 DIAGNOSIS — Z7189 Other specified counseling: Secondary | ICD-10-CM | POA: Diagnosis not present

## 2018-06-06 LAB — CBC WITH AUTOMATED DIFF
ABS. BASOPHILS: 0 10*3/uL (ref 0.0–0.1)
ABS. BASOPHILS: 0.1 10*3/uL (ref 0.0–0.1)
ABS. EOSINOPHILS: 0 10*3/uL (ref 0.0–0.4)
ABS. EOSINOPHILS: 0 10*3/uL (ref 0.0–0.4)
ABS. IMM. GRANS.: 0.2 10*3/uL — ABNORMAL HIGH (ref 0.00–0.04)
ABS. IMM. GRANS.: 0.2 10*3/uL — ABNORMAL HIGH (ref 0.00–0.04)
ABS. LYMPHOCYTES: 0.6 10*3/uL — ABNORMAL LOW (ref 0.8–3.5)
ABS. LYMPHOCYTES: 1.1 10*3/uL (ref 0.8–3.5)
ABS. MONOCYTES: 1.1 10*3/uL — ABNORMAL HIGH (ref 0.0–1.0)
ABS. MONOCYTES: 1.3 10*3/uL — ABNORMAL HIGH (ref 0.0–1.0)
ABS. NEUTROPHILS: 12.2 10*3/uL — ABNORMAL HIGH (ref 1.8–8.0)
ABS. NEUTROPHILS: 14.1 10*3/uL — ABNORMAL HIGH (ref 1.8–8.0)
ABSOLUTE NRBC: 0 10*3/uL (ref 0.00–0.01)
ABSOLUTE NRBC: 0 10*3/uL (ref 0.00–0.01)
BASOPHILS: 0 % (ref 0–1)
BASOPHILS: 0 % (ref 0–1)
EOSINOPHILS: 0 % (ref 0–7)
EOSINOPHILS: 0 % (ref 0–7)
HCT: 41.4 % (ref 35.0–47.0)
HCT: 42.5 % (ref 35.0–47.0)
HGB: 13.8 g/dL (ref 11.5–16.0)
HGB: 14.7 g/dL (ref 11.5–16.0)
IMMATURE GRANULOCYTES: 1 % — ABNORMAL HIGH (ref 0.0–0.5)
IMMATURE GRANULOCYTES: 1 % — ABNORMAL HIGH (ref 0.0–0.5)
LYMPHOCYTES: 4 % — ABNORMAL LOW (ref 12–49)
LYMPHOCYTES: 8 % — ABNORMAL LOW (ref 12–49)
MCH: 29.5 PG (ref 26.0–34.0)
MCH: 29.8 PG (ref 26.0–34.0)
MCHC: 33.3 g/dL (ref 30.0–36.5)
MCHC: 34.6 g/dL (ref 30.0–36.5)
MCV: 86 FL (ref 80.0–99.0)
MCV: 88.5 FL (ref 80.0–99.0)
MONOCYTES: 7 % (ref 5–13)
MONOCYTES: 9 % (ref 5–13)
MPV: 9.3 FL (ref 8.9–12.9)
MPV: 9.5 FL (ref 8.9–12.9)
NEUTROPHILS: 82 % — ABNORMAL HIGH (ref 32–75)
NEUTROPHILS: 88 % — ABNORMAL HIGH (ref 32–75)
NRBC: 0 PER 100 WBC
NRBC: 0 PER 100 WBC
PLATELET: 316 10*3/uL (ref 150–400)
PLATELET: 350 10*3/uL (ref 150–400)
RBC: 4.68 M/uL (ref 3.80–5.20)
RBC: 4.94 M/uL (ref 3.80–5.20)
RDW: 13.4 % (ref 11.5–14.5)
RDW: 13.4 % (ref 11.5–14.5)
WBC: 14.8 10*3/uL — ABNORMAL HIGH (ref 3.6–11.0)
WBC: 16 10*3/uL — ABNORMAL HIGH (ref 3.6–11.0)

## 2018-06-06 LAB — VENOUS BLOOD GAS
VENOUS BASE DEFICIT: 11.6 mmol/L
VENOUS BICARBONATE: 14 mmol/L — ABNORMAL LOW (ref 23–28)
VENOUS O2 SATURATION: 64 % — ABNORMAL LOW (ref 65–88)
VENOUS PCO2: 31 mmHg — ABNORMAL LOW (ref 41–51)
VENOUS PH: 7.27 — ABNORMAL LOW (ref 7.32–7.42)
VENOUS PO2: 37 mmHg (ref 25–40)

## 2018-06-06 LAB — URINALYSIS W/MICROSCOPIC
Bacteria: NEGATIVE /hpf
Bilirubin: NEGATIVE
Glucose: NEGATIVE mg/dL
Ketone: NEGATIVE mg/dL
Nitrites: NEGATIVE
Protein: NEGATIVE mg/dL
Specific gravity: 1.01 (ref 1.003–1.030)
Urobilinogen: 0.2 EU/dL (ref 0.2–1.0)
pH (UA): 5 (ref 5.0–8.0)

## 2018-06-06 LAB — METABOLIC PANEL, COMPREHENSIVE
A-G Ratio: 0.8 — ABNORMAL LOW (ref 1.1–2.2)
A-G Ratio: 0.6 — ABNORMAL LOW (ref 1.1–2.2)
ALT (SGPT): 30 U/L (ref 12–78)
ALT (SGPT): 30 U/L (ref 12–78)
AST (SGOT): 34 U/L (ref 15–37)
AST (SGOT): 34 U/L (ref 15–37)
Albumin: 3 g/dL — ABNORMAL LOW (ref 3.5–5.0)
Albumin: 3.1 g/dL — ABNORMAL LOW (ref 3.5–5.0)
Alk. phosphatase: 166 U/L — ABNORMAL HIGH (ref 45–117)
Alk. phosphatase: 167 U/L — ABNORMAL HIGH (ref 45–117)
Anion gap: 17 mmol/L — ABNORMAL HIGH (ref 5–15)
Anion gap: 17 mmol/L — ABNORMAL HIGH (ref 5–15)
BUN/Creatinine ratio: 67 — ABNORMAL HIGH (ref 12–20)
BUN/Creatinine ratio: 58 — ABNORMAL HIGH (ref 12–20)
BUN: 106 MG/DL — ABNORMAL HIGH (ref 6–20)
BUN: 88 MG/DL — ABNORMAL HIGH (ref 6–20)
Bilirubin, total: 0.7 MG/DL (ref 0.2–1.0)
Bilirubin, total: 0.8 MG/DL (ref 0.2–1.0)
CO2: 13 mmol/L — CL (ref 21–32)
CO2: 12 mmol/L — CL (ref 21–32)
Calcium: 9.1 MG/DL (ref 8.5–10.1)
Calcium: 9.6 MG/DL (ref 8.5–10.1)
Chloride: 99 mmol/L (ref 97–108)
Chloride: 100 mmol/L (ref 97–108)
Creatinine: 1.59 MG/DL — ABNORMAL HIGH (ref 0.55–1.02)
Creatinine: 1.51 MG/DL — ABNORMAL HIGH (ref 0.55–1.02)
GFR est AA: 39 mL/min/{1.73_m2} — ABNORMAL LOW (ref >60)
GFR est AA: 41 mL/min/{1.73_m2} — ABNORMAL LOW (ref >60)
GFR est non-AA: 32 mL/min/{1.73_m2} — ABNORMAL LOW (ref >60)
GFR est non-AA: 34 mL/min/{1.73_m2} — ABNORMAL LOW (ref >60)
Globulin: 3.9 g/dL (ref 2.0–4.0)
Globulin: 4.9 g/dL — ABNORMAL HIGH (ref 2.0–4.0)
Glucose: 192 mg/dL — ABNORMAL HIGH (ref 65–100)
Glucose: 182 mg/dL — ABNORMAL HIGH (ref 65–100)
Potassium: 3.5 mmol/L (ref 3.5–5.1)
Potassium: 3.4 mmol/L — ABNORMAL LOW (ref 3.5–5.1)
Protein, total: 6.9 g/dL (ref 6.4–8.2)
Protein, total: 8 g/dL (ref 6.4–8.2)
Sodium: 129 mmol/L — ABNORMAL LOW (ref 136–145)
Sodium: 129 mmol/L — ABNORMAL LOW (ref 136–145)

## 2018-06-06 LAB — NT-PRO BNP
NT pro-BNP: 1157 PG/ML — ABNORMAL HIGH (ref <125)
NT pro-BNP: 1983 PG/ML — ABNORMAL HIGH (ref <125)

## 2018-06-06 LAB — TROPONIN I
Troponin-I, Qt.: 0.23 ng/mL — ABNORMAL HIGH (ref <0.05)
Troponin-I, Qt.: 0.2 ng/mL — ABNORMAL HIGH (ref <0.05)

## 2018-06-06 LAB — LACTIC ACID
Lactic acid: 3.6 MMOL/L — CR (ref 0.4–2.0)
Lactic acid: 3.6 MMOL/L — CR (ref 0.4–2.0)

## 2018-06-06 LAB — PHOSPHORUS: Phosphorus: 5.2 MG/DL — ABNORMAL HIGH (ref 2.6–4.7)

## 2018-06-06 LAB — SAMPLES BEING HELD

## 2018-06-06 LAB — MAGNESIUM: Magnesium: 2.6 mg/dL — ABNORMAL HIGH (ref 1.6–2.4)

## 2018-06-06 LAB — LIPASE: Lipase: 192 U/L (ref 73–393)

## 2018-06-06 MED ORDER — PROMETHAZINE 25 MG TAB
25 mg | Freq: Four times a day (QID) | ORAL | Status: DC | PRN
Start: 2018-06-06 — End: 2018-06-08

## 2018-06-06 MED ORDER — PROCHLORPERAZINE EDISYLATE 5 MG/ML INJECTION
5 mg/mL | Freq: Four times a day (QID) | INTRAMUSCULAR | Status: DC | PRN
Start: 2018-06-06 — End: 2018-06-08

## 2018-06-06 MED ORDER — SODIUM CHLORIDE 0.9 % IJ SYRG
Freq: Three times a day (TID) | INTRAMUSCULAR | Status: DC
Start: 2018-06-06 — End: 2018-06-08
  Administered 2018-06-07 – 2018-06-08 (×5): via INTRAVENOUS

## 2018-06-06 MED ORDER — ONDANSETRON (PF) 4 MG/2 ML INJECTION
4 mg/2 mL | INTRAMUSCULAR | Status: DC | PRN
Start: 2018-06-06 — End: 2018-06-06

## 2018-06-06 MED ORDER — ENOXAPARIN 40 MG/0.4 ML SUB-Q SYRINGE
40 mg/0.4 mL | SUBCUTANEOUS | Status: DC
Start: 2018-06-06 — End: 2018-06-08

## 2018-06-06 MED ORDER — LACTATED RINGERS BOLUS IV
Freq: Once | INTRAVENOUS | Status: AC
Start: 2018-06-06 — End: 2018-06-06
  Administered 2018-06-06: 19:00:00 via INTRAVENOUS

## 2018-06-06 MED ORDER — ONDANSETRON (PF) 4 MG/2 ML INJECTION
4 mg/2 mL | Freq: Three times a day (TID) | INTRAMUSCULAR | Status: DC | PRN
Start: 2018-06-06 — End: 2018-06-08

## 2018-06-06 MED ORDER — SODIUM CHLORIDE 0.9 % IJ SYRG
INTRAMUSCULAR | Status: DC | PRN
Start: 2018-06-06 — End: 2018-06-08

## 2018-06-06 MED ORDER — DEXTROSE 5% IN NORMAL SALINE IV
INTRAVENOUS | Status: DC
Start: 2018-06-06 — End: 2018-06-07
  Administered 2018-06-06 – 2018-06-07 (×2): via INTRAVENOUS

## 2018-06-06 MED ORDER — POTASSIUM CHLORIDE SR 10 MEQ TAB
10 mEq | ORAL | Status: AC
Start: 2018-06-06 — End: 2018-06-06
  Administered 2018-06-06: 23:00:00 via ORAL

## 2018-06-06 MED ORDER — HYDROCODONE-ACETAMINOPHEN 5 MG-325 MG TAB
5-325 mg | ORAL | Status: DC | PRN
Start: 2018-06-06 — End: 2018-06-08

## 2018-06-06 MED ORDER — ACETAMINOPHEN 325 MG TABLET
325 mg | ORAL | Status: DC | PRN
Start: 2018-06-06 — End: 2018-06-08

## 2018-06-06 MED FILL — ENOXAPARIN 40 MG/0.4 ML SUB-Q SYRINGE: 40 mg/0.4 mL | SUBCUTANEOUS | Qty: 0.4

## 2018-06-06 MED FILL — BD POSIFLUSH NORMAL SALINE 0.9 % INJECTION SYRINGE: INTRAMUSCULAR | Qty: 40

## 2018-06-06 MED FILL — LACTATED RINGERS IV: INTRAVENOUS | Qty: 1000

## 2018-06-06 MED FILL — DEXTROSE 5% IN NORMAL SALINE IV: INTRAVENOUS | Qty: 1000

## 2018-06-06 MED FILL — KLOR-CON 10 MEQ TABLET,EXTENDED RELEASE: 10 mEq | ORAL | Qty: 4

## 2018-06-06 NOTE — Telephone Encounter (Signed)
Darl Pikes from [employer] called our lab to advise of critical lab result.      I called Darl Pikes back, she states they are required to report critical labs, CO2 13. I advised pcp has the results. She verbalized understanding and was thankful.

## 2018-06-06 NOTE — ED Provider Notes (Signed)
The patient is a 73 year old female who presented to the emergency room having been directed by her primary care physician for evaluation.  Over the last 2 weeks she has had increasing loss of appetite, and fatigue.  She did trip up the stairs 2 days ago.  No injuries were sustained.  Yesterday she had routine laboratory obtained which demonstrated an anion gap metabolic acidosis with low bicarb and hyponatremia.  Patient endorses 2 days of diarrhea.  No history of EtOH use disorder.  Patient has been taking approximately 600 mg of Motrin 2-3 times a day for malignancy pain.  Patient is known to have metastatic breast cancer.  History of nonischemic cardiomyopathy and congestive heart failure.  Patient reports that her breathing is currently at baseline.  No PND or orthopnea.  No leg swelling.  No weight gain. Pt denies fevers, chills, night sweats, chest pain, pressure, SOB, DOE, PND, orthopnea, abdominal pain, n/v, melena, hematuria, dysuria, constipation, HA, dizziness, and syncope.             Past Medical History:   Diagnosis Date   ??? Cancer [facility])     breast cancer   ??? Heart failure ([place])        History reviewed. No pertinent surgical history.      Family History:   Problem Relation Age of Onset   ??? Hypertension Mother    ??? Heart Disease Father        Social History     Socioeconomic History   ??? Marital status: DIVORCED     Spouse name: Not on file   ??? Number of children: Not on file   ??? Years of education: Not on file   ??? Highest education level: Not on file   Occupational History   ??? Not on file   Social Needs   ??? Financial resource strain: Not on file   ??? Food insecurity     Worry: Not on file     Inability: Not on file   ??? Transportation needs     Medical: Not on file     Non-medical: Not on file   Tobacco Use   ??? Smoking status: Never Smoker   ??? Smokeless tobacco: Never Used   Substance and Sexual Activity   ??? Alcohol use: Not Currently   ??? Drug use: Never   ??? Sexual activity: Not Currently   Lifestyle    ??? Physical activity     Days per week: Not on file     Minutes per session: Not on file   ??? Stress: Not on file   Relationships   ??? Social Product manager on phone: Not on file     Gets together: Not on file     Attends religious service: Not on file     Active member of club or organization: Not on file     Attends meetings of clubs or organizations: Not on file     Relationship status: Not on file   ??? Intimate partner violence     Fear of current or ex partner: Not on file     Emotionally abused: Not on file     Physically abused: Not on file     Forced sexual activity: Not on file   Other Topics Concern   ??? Not on file   Social History Narrative   ??? Not on file         ALLERGIES: Patient has no known allergies.  Review of Systems   Constitutional: Positive for appetite change. Negative for activity change, chills, diaphoresis, fatigue and fever.   HENT: Negative for congestion, ear pain, rhinorrhea, sinus pressure, sore throat and tinnitus.    Eyes: Negative for photophobia, pain, discharge and visual disturbance.   Respiratory: Negative for apnea, cough, choking, chest tightness, shortness of breath, wheezing and stridor.    Cardiovascular: Negative for chest pain, palpitations and leg swelling.   Gastrointestinal: Positive for diarrhea. Negative for abdominal pain, constipation, nausea and vomiting.   Endocrine: Negative for polydipsia, polyphagia and polyuria.   Genitourinary: Negative for decreased urine volume, dyspareunia, dysuria, enuresis, flank pain, frequency, hematuria and urgency.   Musculoskeletal: Negative for arthralgias, back pain, gait problem, myalgias and neck pain.   Skin: Negative for color change, pallor, rash and wound.   Allergic/Immunologic: Negative for immunocompromised state.   Neurological: Positive for weakness. Negative for dizziness, seizures, syncope, light-headedness and headaches.   Hematological: Does not bruise/bleed easily.    Psychiatric/Behavioral: Negative for agitation and confusion. The patient is not nervous/anxious.        Vitals:    06/06/18 1345 06/06/18 1400 06/06/18 1415 06/06/18 1430   BP: 112/76 129/61 121/59 125/54   Pulse:       Resp:       Temp:       SpO2: 98% 99% 96% 100%   Weight:       Height:                Physical Exam  Vitals signs and nursing note reviewed.   Constitutional:       General: She is not in acute distress.     Appearance: She is well-developed. She is not diaphoretic.   HENT:      Head: Normocephalic.      Right Ear: External ear normal.      Left Ear: External ear normal.      Mouth/Throat:      Pharynx: No oropharyngeal exudate.   Eyes:      General: No scleral icterus.        Right eye: No discharge.         Left eye: No discharge.      Conjunctiva/sclera: Conjunctivae normal.      Pupils: Pupils are equal, round, and reactive to light.   Neck:      Musculoskeletal: Normal range of motion and neck supple.      Thyroid: No thyromegaly.      Vascular: No JVD.      Trachea: No tracheal deviation.   Cardiovascular:      Rate and Rhythm: Normal rate and regular rhythm.      Heart sounds: Normal heart sounds. No murmur. No friction rub. No gallop.    Pulmonary:      Effort: Pulmonary effort is normal. No respiratory distress.      Breath sounds: Normal breath sounds. No stridor. No wheezing or rales.   Chest:      Chest wall: No tenderness.   Abdominal:      General: Bowel sounds are normal. There is no distension.      Palpations: Abdomen is soft. There is no mass.      Tenderness: There is no abdominal tenderness. There is no guarding or rebound.   Musculoskeletal: Normal range of motion.         General: No tenderness.   Lymphadenopathy:      Cervical: No cervical adenopathy.   Skin:  General: Skin is warm and dry.      Coloration: Skin is not pale.      Findings: No erythema or rash.   Neurological:      Mental Status: She is alert and oriented to person, place, and time.       Cranial Nerves: No cranial nerve deficit.      Coordination: Coordination normal.      Deep Tendon Reflexes: Reflexes normal.   Psychiatric:         Behavior: Behavior normal.         Thought Content: Thought content normal.         Judgment: Judgment normal.          MDM  Number of Diagnoses or Management Options  Anorexia:   Elevated troponin:   Failure to thrive in adult:   High anion gap metabolic acidosis:   Lactic acidosis:   Diagnosis management comments:     routine laboratory data vbg CXR ct head and ua         Amount and/or Complexity of Data Reviewed  Clinical lab tests: ordered and reviewed  Tests in the radiology section of CPT??: reviewed and ordered  Discussion of test results with the performing providers: yes  Review and summarize past medical records: yes  Discuss the patient with other providers: yes    Risk of Complications, Morbidity, and/or Mortality  General comments:    - stable, ambulatory pt in NAD    Patient Progress  Patient progress: stable         Procedures     Hospitalist Perfect Serve for Admission  4:38 PM    ED Room Number: ER21/21  Patient Name and age:  Jenny Rivera 73 y.o.  female  Working Diagnosis:   1. High anion gap metabolic acidosis    2. Lactic acidosis    3. Elevated troponin    4. Failure to thrive in adult    5. Anorexia    6. Hyponatremia        COVID-19 Suspicion:  no    Readmission: no  Isolation Requirements:  no  Recommended Level of Care:  telemetry  Department:[facility] - [phone]      3:51 PM  Patient is being admitted to the hospital.  The results of their tests and reasons for their admission have been discussed with them and/or available family.  They convey agreement and understanding for the need to be admitted and for their admission diagnosis.  Consultation has been made with the inpatient physician specialist for hospitalization.    LABORATORY TESTS:  Recent Results (from the past 12 hour(s))   SAMPLES BEING HELD     Collection Time: 06/06/18  1:30 PM   Result Value Ref Range    SAMPLES BEING HELD 1LAV,1PST,1BL,1SST     COMMENT        Add-on orders for these samples will be processed based on acceptable specimen integrity and analyte stability, which may vary by analyte.   CBC WITH AUTOMATED DIFF    Collection Time: 06/06/18  1:30 PM   Result Value Ref Range    WBC 14.8 (H) 3.6 - 11.0 K/uL    RBC 4.94 3.80 - 5.20 M/uL    HGB 14.7 11.5 - 16.0 g/dL    HCT 42.5 35.0 - 47.0 %    MCV 86.0 80.0 - 99.0 FL    MCH 29.8 26.0 - 34.0 PG    MCHC 34.6 30.0 - 36.5 g/dL  RDW 13.4 11.5 - 14.5 %    PLATELET 350 150 - 400 K/uL    MPV 9.3 8.9 - 12.9 FL    NRBC 0.0 0 PER 100 WBC    ABSOLUTE NRBC 0.00 0.00 - 0.01 K/uL    NEUTROPHILS 82 (H) 32 - 75 %    LYMPHOCYTES 8 (L) 12 - 49 %    MONOCYTES 9 5 - 13 %    EOSINOPHILS 0 0 - 7 %    BASOPHILS 0 0 - 1 %    IMMATURE GRANULOCYTES 1 (H) 0.0 - 0.5 %    ABS. NEUTROPHILS 12.2 (H) 1.8 - 8.0 K/UL    ABS. LYMPHOCYTES 1.1 0.8 - 3.5 K/UL    ABS. MONOCYTES 1.3 (H) 0.0 - 1.0 K/UL    ABS. EOSINOPHILS 0.0 0.0 - 0.4 K/UL    ABS. BASOPHILS 0.1 0.0 - 0.1 K/UL    ABS. IMM. GRANS. 0.2 (H) 0.00 - 0.04 K/UL    DF AUTOMATED     LIPASE    Collection Time: 06/06/18  1:30 PM   Result Value Ref Range    Lipase 192 73 - 393 U/L   NT-PRO BNP    Collection Time: 06/06/18  1:30 PM   Result Value Ref Range    NT pro-BNP 1,983 (H) <125 PG/ML   TROPONIN I    Collection Time: 06/06/18  1:30 PM   Result Value Ref Range    Troponin-I, Qt. 0.23 (H) <5.57 ng/mL   METABOLIC PANEL, COMPREHENSIVE    Collection Time: 06/06/18  1:30 PM   Result Value Ref Range    Sodium 129 (L) 136 - 145 mmol/L    Potassium 3.4 (L) 3.5 - 5.1 mmol/L    Chloride 100 97 - 108 mmol/L    CO2 12 (LL) 21 - 32 mmol/L    Anion gap 17 (H) 5 - 15 mmol/L    Glucose 182 (H) 65 - 100 mg/dL    BUN 88 (H) 6 - 20 MG/DL    Creatinine 1.51 (H) 0.55 - 1.02 MG/DL    BUN/Creatinine ratio 58 (H) 12 - 20      GFR est AA 41 (L) >60 ml/min/1.70m     GFR est non-AA 34 (L) >60 ml/min/1.722m   Calcium 9.6 8.5 - 10.1 MG/DL    Bilirubin, total 0.8 0.2 - 1.0 MG/DL    ALT (SGPT) 30 12 - 78 U/L    AST (SGOT) 34 15 - 37 U/L    Alk. phosphatase 167 (H) 45 - 117 U/L    Protein, total 8.0 6.4 - 8.2 g/dL    Albumin 3.1 (L) 3.5 - 5.0 g/dL    Globulin 4.9 (H) 2.0 - 4.0 g/dL    A-G Ratio 0.6 (L) 1.1 - 2.2     VENOUS BLOOD GAS    Collection Time: 06/06/18  1:56 PM   Result Value Ref Range    VENOUS PH 7.27 (L) 7.32 - 7.42      VENOUS PCO2 31 (L) 41 - 51 mmHg    VENOUS PO2 37 25 - 40 mmHg    VENOUS O2 SATURATION 64 (L) 65 - 88 %    VENOUS BICARBONATE 14 (L) 23 - 28 mmol/L    VENOUS BASE DEFICIT 11.6 mmol/L    Sample source VENOUS     LACTIC ACID    Collection Time: 06/06/18  2:28 PM   Result Value Ref Range    Lactic acid 3.6 (HH) 0.4 -  2.0 MMOL/L   URINALYSIS W/MICROSCOPIC    Collection Time: 06/06/18  4:16 PM   Result Value Ref Range    Color YELLOW/STRAW      Appearance CLEAR CLEAR      Specific gravity 1.010 1.003 - 1.030      pH (UA) 5.0 5.0 - 8.0      Protein Negative NEG mg/dL    Glucose Negative NEG mg/dL    Ketone Negative NEG mg/dL    Bilirubin Negative NEG      Blood MODERATE (A) NEG      Urobilinogen 0.2 0.2 - 1.0 EU/dL    Nitrites Negative NEG      Leukocyte Esterase SMALL (A) NEG      WBC 10-20 0 - 4 /hpf    RBC 5-10 0 - 5 /hpf    Epithelial cells FEW FEW /lpf    Bacteria Negative NEG /hpf       IMAGING RESULTS:  CT HEAD WO CONT   Final Result   IMPRESSION:       1. There is bony metastatic disease. There are erosive changes in the parietal   bones bilaterally left greater than right in the parasagittal region with   extradural soft tissue thickening suspicious for metastatic disease which is   inseparable from the sagittal sinus. MR of the brain with contrast is   recommended for further assessment.            XR CHEST PA LAT   Final Result   IMPRESSION:   No acute process.            Xr Chest Pa Lat    Result Date: 06/06/2018   INDICATION:   nausea and vomiting EXAM:  PA and Lateral Chest Radiographs COMPARISON: None FINDINGS: PA and lateral views of the chest demonstrate a normal cardiomediastinal silhouette. The lungs are adequately expanded.  There is no edema, effusion, consolidation, or pneumothorax. There is an old healed right posterior rib fracture.     IMPRESSION: No acute process.     Ct Head Wo Cont    Result Date: 06/06/2018  EXAM: CT HEAD WO CONT INDICATION: hx of met CA COMPARISON: None. CONTRAST: None. TECHNIQUE: Unenhanced CT of the head was performed using 5 mm images. Brain and bone windows were generated.  CT dose reduction was achieved through use of a standardized protocol tailored for this examination and automatic exposure control for dose modulation.  FINDINGS: The ventricles are normal. No intra-axial mass or hemorrhage is identified. There is bony metastatic disease. There are lytic lesions in the right and left parietal bones in the parasagittal region with extradural soft tissue thickening which is inseparable from the sagittal sinus.     IMPRESSION: 1. There is bony metastatic disease. There are erosive changes in the parietal bones bilaterally left greater than right in the parasagittal region with extradural soft tissue thickening suspicious for metastatic disease which is inseparable from the sagittal sinus. MR of the brain with contrast is recommended for further assessment.       MEDICATIONS GIVEN:  Medications   lactated ringers bolus infusion 500 mL (500 mL IntraVENous New Bag 06/06/18 1507)       IMPRESSION:  1. High anion gap metabolic acidosis    2. Lactic acidosis    3. Elevated troponin    4. Failure to thrive in adult    5. Anorexia    6. Hyponatremia        PLAN:  1. Admit  to [place], [place]  5:29 PM

## 2018-06-06 NOTE — H&P (Signed)
History & Physical    Date of admission: 06/06/2018    Patient name: Jenny Rivera  MRN: [id number]  Date of birth: 11-18-1945  Age: 73 y.o.     Primary care provider:  Merrilee Jansky, MD     Source of Information: patient, medical records                              Chief complain: Weakness and nausea    History of present illness  Jenny Rivera is a 73 y.o. female who presents with weakness and nausea.  Patient reports that she has been feeling knotted, tired, and weak for the past 1.5 weeks.  She has metastatic breast cancer she has chosen not to treat.  She does not follow with an oncologist.  Wilburn Mylar she a virtual visit with her primary care provider who ordered blood work.  Found to have metabolic acidosis and lactic acidosis, hyponatremia, hypokalemia.  She was sent to the hospital for further treatment of these metabolic derangements.  She states that yesterday she had 2 rounds of loose stool but has not had any loose stool today.  Her daughter-in-law states that she has not had any food to eat or drink in the last 2 weeks and it is difficult to get her to eat anything.  She complains of pain in her right shoulder that radiated to t the back.  She denies dizziness or lightheadedness, cough, shortness of breath, chest pain, abdominal pain pain, or dysuria.    Past Medical History:   Diagnosis Date   ??? Cancer [facility])     breast cancer   ??? Heart failure ([place])       History reviewed. No pertinent surgical history.  Prior to Admission medications    Medication Sig Start Date End Date Taking? Authorizing Provider   ondansetron hcl (ZOFRAN) 4 mg tablet Take 1 Tab by mouth every eight (8) hours as needed for Nausea or Vomiting. 05/24/79   Simpliciano, Anderson Malta, MD   spironolactone (ALDACTONE) 25 mg tablet Take 0.5 Tabs by mouth daily. 03/13/18   Vear Clock T, DO   metoprolol succinate (TOPROL-XL) 50 mg XL tablet Take 1 Tab by mouth  daily. 03/13/18   Vear Clock T, DO   irbesartan (AVAPRO) 75 mg tablet Take 1 Tab by mouth nightly. 03/13/18   Vear Clock T, DO   furosemide (LASIX) 40 mg tablet Take 1 Tab by mouth daily. 03/13/18   Vear Clock T, DO   potassium chloride SR (KLOR-CON 10) 10 mEq tablet Take 2 Tabs by mouth daily. 03/13/18   Vear Clock T, DO   aspirin delayed-release 81 mg tablet Take  by mouth daily.    Provider, Historical     No Known Allergies   Family History   Problem Relation Age of Onset   ??? Hypertension Mother    ??? Heart Disease Father       Family history reviewed and non-contributory.     Social history  Patient resides    Independently    X  With family care      Assisted living      SNF    Ambulates  X  Independently      With cane       Assisted walker         Alcohol history   X  None     Social     Chronic   Smoking history  X  None     Former smoker     Current smoker     Social History     Tobacco Use   Smoking Status Never Smoker   Smokeless Tobacco Never Used       Code status    Full code   X  DNR/DNI        Code status discussed with the patient/caregivers.  DNR    Review of systems  The patient denies any fever, chills, chest pain, cough, congestion, recent illness, palpitations, or dysuria.   A comprehensive review of systems was negative except for that written in the History of Present Illness.   The remainder of the review of systems was reviewed and is noncontributory.    Physical Examination   Visit Vitals  BP 120/56   Pulse 78   Temp 98.7 ??F (37.1 ??C)   Resp 16   Ht '5\' 1"'  (1.549 m)   Wt 65.8 kg (145 lb)   SpO2 100%   BMI 27.40 kg/m??          O2 Device: Room air    General:  Alert, cooperative, no distress   Head:  Normocephalic, without obvious abnormality, atraumatic   Eyes:  Conjunctivae/corneas clear. PERRL, EOMs intact   E/N/M/T: Nares normal. Septum midline. No nasal drainage or sinus tenderness  Lips, mucosa, and tongue normal   Teeth and gums normal  Clear oropharynx    Neck: Normal appearance and movements, symmetrical, trachea midline  No palpable adenopathy  No thyroid enlargement, tenderness or nodules  No carotid bruit   Normal JVP   Lungs:   Symmetrical chest expansion and respiratory effort  Clear to auscultation bilaterally   Chest wall:  No tenderness or deformity   Heart:  Regular rhythm   Sounds normal; no murmur, click, rub or gallop   Abdomen:   Soft, no tenderness  Bowel sounds normal  No masses or hepatosplenomegaly  No hernias present   Back: No CVA tenderness   Extremities: Extremities normal, atraumatic  No cyanosis or edema  No DVT signs   Pulses 2+ and symmetric all extremities   Skin: No rashes or ulcers   Musculo-      skeletal: Gait not tested  Normal symmetry, ROM, strength and tone   Neuro: Normal cranial nerves  Normal reflexes and sensation   Psych: Alert, oriented x3  Normal affect, judgement and insight         Data Review      24 Hour Results:  Recent Results (from the past 24 hour(s))   SAMPLES BEING HELD    Collection Time: 06/06/18  1:30 PM   Result Value Ref Range    SAMPLES BEING HELD 1LAV,1PST,1BL,1SST     COMMENT        Add-on orders for these samples will be processed based on acceptable specimen integrity and analyte stability, which may vary by analyte.   CBC WITH AUTOMATED DIFF    Collection Time: 06/06/18  1:30 PM   Result Value Ref Range    WBC 14.8 (H) 3.6 - 11.0 K/uL    RBC 4.94 3.80 - 5.20 M/uL    HGB 14.7 11.5 - 16.0 g/dL    HCT 42.5 35.0 - 47.0 %    MCV 86.0 80.0 - 99.0 FL    MCH 29.8 26.0 - 34.0 PG    MCHC 34.6 30.0 - 36.5 g/dL    RDW 13.4 11.5 - 14.5 %    PLATELET 350  150 - 400 K/uL    MPV 9.3 8.9 - 12.9 FL    NRBC 0.0 0 PER 100 WBC    ABSOLUTE NRBC 0.00 0.00 - 0.01 K/uL    NEUTROPHILS 82 (H) 32 - 75 %    LYMPHOCYTES 8 (L) 12 - 49 %    MONOCYTES 9 5 - 13 %    EOSINOPHILS 0 0 - 7 %    BASOPHILS 0 0 - 1 %    IMMATURE GRANULOCYTES 1 (H) 0.0 - 0.5 %    ABS. NEUTROPHILS 12.2 (H) 1.8 - 8.0 K/UL    ABS. LYMPHOCYTES 1.1 0.8 - 3.5 K/UL     ABS. MONOCYTES 1.3 (H) 0.0 - 1.0 K/UL    ABS. EOSINOPHILS 0.0 0.0 - 0.4 K/UL    ABS. BASOPHILS 0.1 0.0 - 0.1 K/UL    ABS. IMM. GRANS. 0.2 (H) 0.00 - 0.04 K/UL    DF AUTOMATED     LIPASE    Collection Time: 06/06/18  1:30 PM   Result Value Ref Range    Lipase 192 73 - 393 U/L   NT-PRO BNP    Collection Time: 06/06/18  1:30 PM   Result Value Ref Range    NT pro-BNP 1,983 (H) <125 PG/ML   TROPONIN I    Collection Time: 06/06/18  1:30 PM   Result Value Ref Range    Troponin-I, Qt. 0.23 (H) <4.70 ng/mL   METABOLIC PANEL, COMPREHENSIVE    Collection Time: 06/06/18  1:30 PM   Result Value Ref Range    Sodium 129 (L) 136 - 145 mmol/L    Potassium 3.4 (L) 3.5 - 5.1 mmol/L    Chloride 100 97 - 108 mmol/L    CO2 12 (LL) 21 - 32 mmol/L    Anion gap 17 (H) 5 - 15 mmol/L    Glucose 182 (H) 65 - 100 mg/dL    BUN 88 (H) 6 - 20 MG/DL    Creatinine 1.51 (H) 0.55 - 1.02 MG/DL    BUN/Creatinine ratio 58 (H) 12 - 20      GFR est AA 41 (L) >60 ml/min/1.40m    GFR est non-AA 34 (L) >60 ml/min/1.748m   Calcium 9.6 8.5 - 10.1 MG/DL    Bilirubin, total 0.8 0.2 - 1.0 MG/DL    ALT (SGPT) 30 12 - 78 U/L    AST (SGOT) 34 15 - 37 U/L    Alk. phosphatase 167 (H) 45 - 117 U/L    Protein, total 8.0 6.4 - 8.2 g/dL    Albumin 3.1 (L) 3.5 - 5.0 g/dL    Globulin 4.9 (H) 2.0 - 4.0 g/dL    A-G Ratio 0.6 (L) 1.1 - 2.2     VENOUS BLOOD GAS    Collection Time: 06/06/18  1:56 PM   Result Value Ref Range    VENOUS PH 7.27 (L) 7.32 - 7.42      VENOUS PCO2 31 (L) 41 - 51 mmHg    VENOUS PO2 37 25 - 40 mmHg    VENOUS O2 SATURATION 64 (L) 65 - 88 %    VENOUS BICARBONATE 14 (L) 23 - 28 mmol/L    VENOUS BASE DEFICIT 11.6 mmol/L    Sample source VENOUS     LACTIC ACID    Collection Time: 06/06/18  2:28 PM   Result Value Ref Range    Lactic acid 3.6 (HH) 0.4 - 2.0 MMOL/L   URINALYSIS W/MICROSCOPIC    Collection Time:  06/06/18  4:16 PM   Result Value Ref Range    Color YELLOW/STRAW      Appearance CLEAR CLEAR      Specific gravity 1.010 1.003 - 1.030       pH (UA) 5.0 5.0 - 8.0      Protein Negative NEG mg/dL    Glucose Negative NEG mg/dL    Ketone Negative NEG mg/dL    Bilirubin Negative NEG      Blood MODERATE (A) NEG      Urobilinogen 0.2 0.2 - 1.0 EU/dL    Nitrites Negative NEG      Leukocyte Esterase SMALL (A) NEG      WBC 10-20 0 - 4 /hpf    RBC 5-10 0 - 5 /hpf    Epithelial cells FEW FEW /lpf    Bacteria Negative NEG /hpf   TROPONIN I    Collection Time: 06/06/18  5:36 PM   Result Value Ref Range    Troponin-I, Qt. 0.20 (H) <0.05 ng/mL   LACTIC ACID    Collection Time: 06/06/18  5:36 PM   Result Value Ref Range    Lactic acid 3.6 (HH) 0.4 - 2.0 MMOL/L     Recent Labs     06/06/18  1330 06/05/18  1500   WBC 14.8* 16.0*   HGB 14.7 13.8   HCT 42.5 41.4   PLT 350 316     Recent Labs     06/06/18  1330 06/05/18  1500   NA 129* 129*   K 3.4* 3.5   CL 100 99   CO2 12* 13*   GLU 182* 192*   BUN 88* 106*   CREA 1.51* 1.59*   CA 9.6 9.1   MG  --  2.6*   PHOS  --  5.2*   ALB 3.1* 3.0*   TBILI 0.8 0.7   SGOT 34 34   ALT 30 30       Imaging  Xr Chest Pa Lat    Result Date: 06/06/2018  INDICATION:   nausea and vomiting EXAM:  PA and Lateral Chest Radiographs COMPARISON: None FINDINGS: PA and lateral views of the chest demonstrate a normal cardiomediastinal silhouette. The lungs are adequately expanded.  There is no edema, effusion, consolidation, or pneumothorax. There is an old healed right posterior rib fracture.     IMPRESSION: No acute process.     Ct Head Wo Cont    Result Date: 06/06/2018  EXAM: CT HEAD WO CONT INDICATION: hx of met CA COMPARISON: None. CONTRAST: None. TECHNIQUE: Unenhanced CT of the head was performed using 5 mm images. Brain and bone windows were generated.  CT dose reduction was achieved through use of a standardized protocol tailored for this examination and automatic exposure control for dose modulation.  FINDINGS: The ventricles are normal. No intra-axial mass or hemorrhage is  identified. There is bony metastatic disease. There are lytic lesions in the right and left parietal bones in the parasagittal region with extradural soft tissue thickening which is inseparable from the sagittal sinus.     IMPRESSION: 1. There is bony metastatic disease. There are erosive changes in the parietal bones bilaterally left greater than right in the parasagittal region with extradural soft tissue thickening suspicious for metastatic disease which is inseparable from the sagittal sinus. MR of the brain with contrast is recommended for further assessment.         Assessment and Plan   Metabolic acidosis  ???Likely malignancy related  ???Patient  received 500 cc bolus in the ER  ??? We will continue gentle hydration and repeat lactic acid    Metastatic breast cancer  ???Not treated per patient's choice  ??? Consult palliative  ???Patient is DNR    Loss of appetite  ??? This is cancer related  ??? Zofran, and again, Compazine as needed for nausea  ???Palliative consult for further recommendations    Heart failure with reduced ejection fraction  ???Patient does not have a defibrillator  ???We will repeat echo  ???Consult cardiology    Elevated troponin  ???We will trend    Leukocytosis  ??? No sepsis  ???No clear cause of this, could be hemoconcentration  ???Will hydrate and monitor    Hyponatremia  ??? From poor oral intake  ??? Fluids and monitor    Hypokalemia  ???From poor oral intake  ???We will replace and monitor    Acute  kidney injury  ???From dehydration  ??? Fluids and monitor    Diet: Regular  Activity: Tolerated with assistance  DVT prophylaxis: Lovenox  Isolation precautions: None  Consultations: Cardiology, palliative  Anticipated disposition: Home       Signed by: Huey Bienenstock, MD    June 06, 2018 at 6:20 PM

## 2018-06-06 NOTE — ACP (Advance Care Planning) (Signed)
[facility]                                      Advance Care Planning Note    Name: Jenny Rivera  Date of birth: [birth year]  MRN: [id number]  Admission Date: 06/06/2018  1:06 PM    Date of discussion: 06/06/2018    Active Diagnoses:    Hospital Problems  Date Reviewed: 06-09-2018          Codes Class Noted POA    Metabolic acidosis ICD-10-CM: E87.2  ICD-9-CM: 276.2  06/06/2018 Unknown              These active diagnoses are of sufficient risk that focused discussion on advance care planning is indicated in order to allow the patient to thoughtfully consider personal goals of care, and if situations arise that prevent the ability to personally give input, to ensure appropriate representation of their personal desires for different levels and aggressiveness of care.     Discussion:     Persons present and participating in discussion: Jenny Rivera, Jenny Ridges, MD    Discussion: Discussed with patient and daughter in law (over the phone) regarding heroic measures. Jenny Rivera wishes to be DNR/DNI. She does not want treatment for metastatic breast cancer. She wishes to have a hospice consult. She names her son Jenny Rivera [phone]) and daughter in law Jenny Rivera [phone]) as her surrogate decision makers.     Time Spent:     Total time spent face-to-face in education and discussion: 20 minutes.     Jenny Ridges, MD  Date of Service:  06/06/2018  6:52 PM

## 2018-06-06 NOTE — Progress Notes (Signed)
TRANSFER - IN REPORT:    Verbal report received from RN (name) on Jenny Rivera  being received from ER (unit) for routine progression of care      Report consisted of patient???s Situation, Background, Assessment and   Recommendations(SBAR).     Information from the following report(s) SBAR, Kardex, Intake/Output and MAR was reviewed with the receiving nurse.    Opportunity for questions and clarification was provided.      Assessment completed upon patient???s arrival to unit and care assumed.       Patient received to unit. Vital signs stable patient alert and oriented.    Bedside and Verbal shift change report given to Cordelia Pen, Charity fundraiser (oncoming nurse) by Janeece Agee, RN (offgoing nurse). Report included the following information SBAR, Kardex, Intake/Output and MAR.

## 2018-06-06 NOTE — Telephone Encounter (Signed)
Pt with metastatic breast cancer (not treated per pt preference) and CHF EF 20-25%.  Labs drawn yesterday significant for hyponatremia 129, high anion gap metabolic acidosis, AKI (1.59 from BL 1.0), and WBC 16.  Called pt's cell, but no answer.  Called pt's daughter.  Daughter reports that the pt developed diarrhea overnight.  Advised that she should go to the ER for further management and workup; advised of possible admission.  I called [facility] for handoff.  Pt is DNR/DNI per my discussion with her.  Daughter had also asked about hospice during our virtual visit.  Advised daughter to see if they could help coordinate that if pt ends up admitted.

## 2018-06-06 NOTE — ED Triage Notes (Signed)
Patient arrives to the ED complaining of nausea and tiredness x 2 weeks. Denies fever, shortness of breath, chest pain.  Patient has been seen by her provider. Repost that she might have an infection. Provider recommended to be seen by ER. Patient has Hx of CHF, cancer.

## 2018-06-06 NOTE — ACP (Advance Care Planning) (Signed)
06/06/2018  6:33 PM    Advance Care Planning Clinical Specialist  Conversation Note      Date of ACP Conversation: 06/06/2018    Conversation Conducted with:   Patient with Decision Making Capacity      ACP Clinical Specialist: Derrill Kay        Current Designated Health Care Decision Maker:   Primary Decision Maker: Sandra, Kosmalski - Son - [phone]    Secondary Decision Maker: Jaster,wendy - Daughter-in-Law - [phone]  (as entered in ACP Activity Healthcare Decision Maker field.  Validate  this information as still accurate & up-to-date; edit Healthcare Decision Maker field as needed.)          Ventilation  If you were in your present state of health and suddenly became very ill and were unable to breathe on your own, what would your preference be about the use of a ventilator (breathing machine) if it were available to you?      If patient would desire the use of a ventilator (breathing machine), NO        Resuscitation  CPR works best to restart the heart when there is a sudden event, like a heart attack, in someone who is otherwise healthy. Unfortunately, CPR does not typically restart the heart for people who have serious health conditions or who are very sick.    In the event your heart stopped, would you want attempts to restart your heart or would you prefer a natural death. "No, I would prefer a natural death"        [x]  Yes  []  No   Educated Patient / Decision Maker regarding differences between Advance Directives and portable DNR orders.    Length of ACP Conversation in minutes:  20 Minutes   Conversation Outcomes:  [x]  ACP discussion completed  []  Existing advance directive reviewed with patient; no changes to patient's previously recorded wishes   []  New Advance Directive completed   []  Portable Do Not Rescitate prepared for Provider review and signature  []  POLST/POST/MOLST/MOST prepared for Provider review and signature      Follow-up plan:     [x]  Schedule follow-up conversation to continue planning  [x]  Referred individual to Provider for additional questions/concerns   []  Advised patient/agent/surrogate to review completed ACP document and update if needed with changes in condition, patient preferences or care setting     [x]  This note routed to one or more involved healthcare providers

## 2018-06-06 NOTE — Progress Notes (Addendum)
06/06/2018  6:02 PM  Case management note:    Completed Advance Planning note, Patient wishes to be DNR. I advised to speak physician regarding this.    Reason for Admission:   Metabolic acidosis  Patient has breast cancer, was sent over from MD office  She lives with son and daughter in law.  [facility]  Patient has history of breast cancer, chf, cardiomyopathy. Patient has loss appetite and getting weaker.  She does not drive, can dress and feed herself.                    RUR Score:     13%             PCP: First and Last name:  Victorino Dike Simpliciano   Name of Practice:    Are you a current patient: Yes/No: yes   Approximate date of last visit: 3 months    Do you (patient/family) have any concerns for transition/discharge?     Patient indicated she wanted to be DNR, possible hospice discharge              Plan for utilizing home health:   To be determined while hospitalized    Current Advanced Directive/Advance Care Plan:  Currently does not have, will do one while here.             Transition of Care Plan:          1. Home with family assistance  2. PCP follow up  3. Hospice/ Palliative consult  4. CM to follow through discharge    Care Management Interventions  PCP Verified by CM: Yes(dr. Su Grand)  Mode of Transport at Discharge: Self  Transition of Care Consult (CM Consult): Discharge Planning  Current Support Network: Relative's Home  Confirm Follow Up Transport: Family  The Plan for Transition of Care is Related to the Following Treatment Goals : metabolic acidosis, cancer  Discharge Location  Discharge Placement: Home with family assistance  Fredrik Rigger BSRT

## 2018-06-06 NOTE — ED Notes (Signed)
Respiratory called for venous blood gas.

## 2018-06-06 NOTE — Progress Notes (Signed)
Admission Medication Reconciliation:     Information obtained from:   Pharmacist called the patient's room and completed the interview over the phone.   RxQuery data available??:  YES    Comments/Recommendations:   Updated PTA medication list  Verified preferred pharmacy  Reviewed patient's allergies     ??RxQuery pharmacy benefit data reflects medications filled and processed through the patient's insurance, however   this data does NOT capture whether the medication was picked up or is currently being taken by the patient.    Prior to Admission Medications   Prescriptions Last Dose Informant Taking?   aspirin delayed-release 81 mg tablet 06/06/2018 at Unknown time Self Yes   Sig: Take 81 mg by mouth daily.   furosemide (LASIX) 40 mg tablet 06/06/2018 at Unknown time Self Yes   Sig: Take 1 Tab by mouth daily.   irbesartan (AVAPRO) 75 mg tablet 06/05/2018 at Unknown time Self Yes   Sig: Take 1 Tab by mouth nightly.   metoprolol succinate (TOPROL-XL) 50 mg XL tablet 06/06/2018 at Unknown time Self Yes   Sig: Take 1 Tab by mouth daily.   ondansetron hcl (ZOFRAN) 4 mg tablet  Self Yes   Sig: Take 1 Tab by mouth every eight (8) hours as needed for Nausea or Vomiting.   potassium chloride (Klor-Con M10) 10 mEq tablet 06/06/2018 at Unknown time Self Yes   Sig: Take 10 mEq by mouth two (2) times a day.   spironolactone (ALDACTONE) 25 mg tablet 06/06/2018 at Unknown time Self Yes   Sig: Take 0.5 Tabs by mouth daily.      Facility-Administered Medications: None          Please contact the main inpatient pharmacy with any questions or concerns at [phone] and we will direct you to the clinical pharmacist covering this patient's care while in-house.   Shawnie Pons, PharmD, BCPS

## 2018-06-06 NOTE — ED Notes (Signed)
TRANSFER - OUT REPORT:    Verbal report given to Dow Adolph, RN (name) on Majesta Severn  being transferred to 5th floor(unit) for routine progression of care       Report consisted of patient???s Situation, Background, Assessment and   Recommendations(SBAR).     Information from the following report(s) SBAR, ED Summary and MAR was reviewed with the receiving nurse.    Lines:   Peripheral IV 06/06/18 Right Antecubital (Active)   Site Assessment Clean, dry, & intact 06/06/2018  1:35 PM   Phlebitis Assessment 0 06/06/2018  1:35 PM   Infiltration Assessment 0 06/06/2018  1:35 PM   Dressing Status Clean, dry, & intact 06/06/2018  1:35 PM   Dressing Type Transparent 06/06/2018  1:35 PM   Hub Color/Line Status Pink 06/06/2018  1:35 PM        Opportunity for questions and clarification was provided.      Patient transported with:   [employer]

## 2018-06-07 ENCOUNTER — Inpatient Hospital Stay: Admit: 2018-06-07 | Payer: MEDICARE | Primary: Internal Medicine

## 2018-06-07 LAB — EKG, 12 LEAD, INITIAL
Atrial Rate: 67 {beats}/min
Calculated P Axis: 43 degrees
Calculated R Axis: -8 degrees
Calculated T Axis: 103 degrees
Diagnosis: NORMAL
P-R Interval: 140 ms
Q-T Interval: 374 ms
QRS Duration: 92 ms
QTC Calculation (Bezet): 395 ms
Ventricular Rate: 67 {beats}/min

## 2018-06-07 LAB — ECHO ADULT COMPLETE
AV Area by Peak Velocity: 1.9 cm2
AV Area by VTI: 2.2 cm2
AV Mean Gradient: 11.1 mmHg
AV Mean Velocity: 1.5198 m/s
AV Peak Gradient: 20.3 mmHg
AV Peak Velocity: 225.41 cm/s
AV VTI Ratio: 0.7
AV VTI: 43.63 cm
AV Velocity Ratio: 0.63
Aortic Root: 3.17 cm
Est. RA Pressure: 3 mmHg
IVSd: 0.79 cm (ref 0.6–0.9)
LA Major Axis: 2.5 cm
LA Volume 2C: 49.13 mL (ref 22–52)
LA Volume 4C: 49.29 mL (ref 22–52)
LA Volume BP: 61 mL (ref 22–52)
LA Volume Index 2C: 29.82 ml/m2 (ref 16–28)
LA Volume Index 4C: 29.91 ml/m2 (ref 16–28)
LA Volume Index BP: 37.02 ml/m2 (ref 16–28)
LA/AO Root Ratio: 0.79
LV Mass 2D Index: 53.6 g/m2 (ref 43–95)
LV Mass 2D: 88.3 g (ref 67–162)
LVIDd: 3.73 cm — AB (ref 3.9–5.3)
LVIDs: 2.63 cm
LVOT Diameter: 1.98 cm
LVOT Peak Gradient: 8 mmHg
LVOT Peak Velocity: 141.68 cm/s
LVOT SV: 94.4 ml
LVOT VTI: 30.81 cm
LVPWd: 0.79 cm (ref 0.6–0.9)
Left Ventricular Fractional Shortening by 2D: 29.4148 %
Left Ventricular Outflow Tract Mean Gradient: 4.4638 mmHg
Left Ventricular Outflow Tract Mean Velocity: 0.9688 cm/s
MR Peak Gradient: 99.3 mmHg
MR Peak Velocity: 498.36 cm/s
MV A Velocity: 89.9 cm/s
MV E Velocity: 56.33 cm/s
MV E Wave Deceleration Time: 422.6 ms
MV E/A: 0.63
Mitral Valve Deceleration Slope: 1.3403
PASP: 36.6 mmHg
PI End Diastolic Pressure: 7.5 mmHg
PV End Diastolic Velocity: 1.1 mmHg
PV Max Velocity: 116.96 cm/s
PV Peak Gradient: 5.5 mmHg
Pulmonary Artery Mean Presure: 17.2 mmHg
RVIDd: 3.68 cm
RVSP: 36.6 mmHg
TR Max Velocity: 289.9 cm/s
TR Peak Gradient: 33.6 mmHg

## 2018-06-07 LAB — CBC WITH AUTOMATED DIFF
ABS. BASOPHILS: 0 10*3/uL (ref 0.0–0.1)
ABS. EOSINOPHILS: 0.1 10*3/uL (ref 0.0–0.4)
ABS. IMM. GRANS.: 0.2 10*3/uL — ABNORMAL HIGH (ref 0.00–0.04)
ABS. LYMPHOCYTES: 1.1 10*3/uL (ref 0.8–3.5)
ABS. MONOCYTES: 1 10*3/uL (ref 0.0–1.0)
ABS. NEUTROPHILS: 9.2 10*3/uL — ABNORMAL HIGH (ref 1.8–8.0)
ABSOLUTE NRBC: 0 10*3/uL (ref 0.00–0.01)
BASOPHILS: 0 % (ref 0–1)
EOSINOPHILS: 1 % (ref 0–7)
HCT: 38.9 % (ref 35.0–47.0)
HGB: 13.4 g/dL (ref 11.5–16.0)
IMMATURE GRANULOCYTES: 2 % — ABNORMAL HIGH (ref 0.0–0.5)
LYMPHOCYTES: 10 % — ABNORMAL LOW (ref 12–49)
MCH: 29.8 PG (ref 26.0–34.0)
MCHC: 34.4 g/dL (ref 30.0–36.5)
MCV: 86.4 FL (ref 80.0–99.0)
MONOCYTES: 8 % (ref 5–13)
MPV: 8.8 FL — ABNORMAL LOW (ref 8.9–12.9)
NEUTROPHILS: 79 % — ABNORMAL HIGH (ref 32–75)
NRBC: 0 PER 100 WBC
PLATELET: 293 10*3/uL (ref 150–400)
RBC: 4.5 M/uL (ref 3.80–5.20)
RDW: 13.1 % (ref 11.5–14.5)
WBC: 11.5 10*3/uL — ABNORMAL HIGH (ref 3.6–11.0)

## 2018-06-07 LAB — BLOOD GAS, ARTERIAL
BASE DEFICIT: 9.3 mmol/L
BICARBONATE: 14 mmol/L — ABNORMAL LOW (ref 22–26)
O2 SAT: 98 % — ABNORMAL HIGH (ref 92–97)
PCO2: 24 mmHg — ABNORMAL LOW (ref 35.0–45.0)
PO2: 104 mmHg — ABNORMAL HIGH (ref 80–100)
pH: 7.37 (ref 7.35–7.45)

## 2018-06-07 LAB — METABOLIC PANEL, COMPREHENSIVE
A-G Ratio: 0.7 — ABNORMAL LOW (ref 1.1–2.2)
ALT (SGPT): 24 U/L (ref 12–78)
AST (SGOT): 26 U/L (ref 15–37)
Albumin: 2.6 g/dL — ABNORMAL LOW (ref 3.5–5.0)
Alk. phosphatase: 152 U/L — ABNORMAL HIGH (ref 45–117)
Anion gap: 15 mmol/L (ref 5–15)
BUN/Creatinine ratio: 58 — ABNORMAL HIGH (ref 12–20)
BUN: 71 MG/DL — ABNORMAL HIGH (ref 6–20)
Bilirubin, total: 0.8 MG/DL (ref 0.2–1.0)
CO2: 16 mmol/L — ABNORMAL LOW (ref 21–32)
Calcium: 8.6 MG/DL (ref 8.5–10.1)
Chloride: 102 mmol/L (ref 97–108)
Creatinine: 1.23 MG/DL — ABNORMAL HIGH (ref 0.55–1.02)
GFR est AA: 52 mL/min/{1.73_m2} — ABNORMAL LOW (ref >60)
GFR est non-AA: 43 mL/min/{1.73_m2} — ABNORMAL LOW (ref >60)
Globulin: 3.8 g/dL (ref 2.0–4.0)
Glucose: 198 mg/dL — ABNORMAL HIGH (ref 65–100)
Potassium: 3.7 mmol/L (ref 3.5–5.1)
Protein, total: 6.4 g/dL (ref 6.4–8.2)
Sodium: 133 mmol/L — ABNORMAL LOW (ref 136–145)

## 2018-06-07 LAB — PHOSPHORUS: Phosphorus: 3.1 MG/DL (ref 2.6–4.7)

## 2018-06-07 LAB — SODIUM, UR, RANDOM: Sodium,urine random: 46 MMOL/L

## 2018-06-07 LAB — MAGNESIUM: Magnesium: 2 mg/dL (ref 1.6–2.4)

## 2018-06-07 LAB — LACTIC ACID
Lactic acid: 3.6 MMOL/L — CR (ref 0.4–2.0)
Lactic acid: 3.6 MMOL/L — CR (ref 0.4–2.0)

## 2018-06-07 LAB — CHLORIDE, URINE RANDOM: Chloride,urine random: 75 MMOL/L

## 2018-06-07 LAB — TROPONIN I
Troponin-I, Qt.: 0.15 ng/mL — ABNORMAL HIGH (ref <0.05)
Troponin-I, Qt.: 0.14 ng/mL — ABNORMAL HIGH (ref <0.05)

## 2018-06-07 LAB — HEMOGLOBIN A1C WITH EAG
Est. average glucose: 148 mg/dL
Hemoglobin A1c: 6.8 % — ABNORMAL HIGH (ref 4.0–5.6)

## 2018-06-07 LAB — POTASSIUM, UR, RANDOM: Potassium urine, random: 27 MMOL/L

## 2018-06-07 LAB — OSMOLALITY, UR: Osmolality,urine: 356 MOSM/kg H2O

## 2018-06-07 MED ORDER — DEXAMETHASONE 4 MG TAB
4 mg | Freq: Every day | ORAL | Status: DC
Start: 2018-06-07 — End: 2018-06-08
  Administered 2018-06-08: 13:00:00 via ORAL

## 2018-06-07 MED ORDER — INSULIN LISPRO 100 UNIT/ML INJECTION
100 unit/mL | Freq: Four times a day (QID) | SUBCUTANEOUS | Status: DC
Start: 2018-06-07 — End: 2018-06-07

## 2018-06-07 MED ORDER — DEXAMETHASONE 4 MG TAB
4 mg | Freq: Two times a day (BID) | ORAL | Status: DC
Start: 2018-06-07 — End: 2018-06-07

## 2018-06-07 MED ORDER — GLUCOSE 4 GRAM CHEWABLE TAB
4 gram | ORAL | Status: DC | PRN
Start: 2018-06-07 — End: 2018-06-08

## 2018-06-07 MED ORDER — METOPROLOL SUCCINATE SR 50 MG 24 HR TAB
50 mg | Freq: Every day | ORAL | Status: DC
Start: 2018-06-07 — End: 2018-06-08
  Administered 2018-06-08: 13:00:00 via ORAL

## 2018-06-07 MED ORDER — SODIUM BICARBONATE 650 MG TAB
650 mg | Freq: Three times a day (TID) | ORAL | Status: DC
Start: 2018-06-07 — End: 2018-06-08
  Administered 2018-06-07 – 2018-06-08 (×3): via ORAL

## 2018-06-07 MED ORDER — ASPIRIN 81 MG TAB, DELAYED RELEASE
81 mg | Freq: Every day | ORAL | Status: DC
Start: 2018-06-07 — End: 2018-06-08
  Administered 2018-06-08: 13:00:00 via ORAL

## 2018-06-07 MED ORDER — GLUCAGON 1 MG INJECTION
1 mg | INTRAMUSCULAR | Status: DC | PRN
Start: 2018-06-07 — End: 2018-06-08

## 2018-06-07 MED ORDER — DEXTROSE 10% IN WATER (D10W) IV
10 % | INTRAVENOUS | Status: DC | PRN
Start: 2018-06-07 — End: 2018-06-08

## 2018-06-07 MED ORDER — SODIUM CHLORIDE 0.9 % IV
INTRAVENOUS | Status: AC
Start: 2018-06-07 — End: 2018-06-08
  Administered 2018-06-07: 13:00:00 via INTRAVENOUS

## 2018-06-07 MED FILL — SODIUM CHLORIDE 0.9 % IV: INTRAVENOUS | Qty: 250

## 2018-06-07 MED FILL — SODIUM BICARBONATE 650 MG TAB: 650 mg | ORAL | Qty: 1

## 2018-06-07 NOTE — Progress Notes (Signed)
Orders received and chart reviewed. Attempted to see patient but after speaking with Dr. Sudie Bailey, PT was deferred. MD stated that patient is going home tomorrow with hospice and will likely refuse PT. PT will complete orders. Please re-consult if needed.  Notified nursing. Thanks.

## 2018-06-07 NOTE — Progress Notes (Signed)
Full note to follow: pt seen and examined. Denies vomiting or ab pain. States she is intermittently nauseated but it's more she "just doesn't have an appetite" Last BM was 2 days ago. Explained plan of care with regards to her dehydration and AKI. Discussed what her goals were with this hospitalization and she stated that she does not want additional testing and ideally wants to get home. I discussed the role of palliative and she states she is amenable to discussions with them. She was told by someone in the past that "she probably won't qualify for hospice". With her advanced cardiomyopathy, metastatic breast CA and now AKI/metabolic acidosis/lactic acidosis, I would argue that perhaps she now may qualify for hospice but will defer to palliative.

## 2018-06-07 NOTE — Progress Notes (Signed)
Spiritual Care Assessment/Progress Note  [facility]      NAME: Jenny Rivera      MRN: [id number]  AGE: 73 y.o. SEX: female  Religious Affiliation:    Language: English     06/07/2018     Total Time (in minutes): 5     Spiritual Assessment begun in [facility] 5M1 MED SURG 1 through conversation with:         [x] Patient        []  Family    []  Friend(s)        Reason for Consult: Initial/Spiritual assessment, patient floor     Spiritual beliefs: (Please include comment if needed)     []  Identifies with a faith tradition:         []  Supported by a faith community:            []  Claims no spiritual orientation:           []  Seeking spiritual identity:                []  Adheres to an individual form of spirituality:           [x]  Not able to assess: Pt sleeping                      Identified resources for coping:      []  Prayer                               []  Music                  []  Guided Imagery     []  Family/friends                 []  Pet visits     []  Devotional reading                         []  Unknown     [x]  Other:    Unable to assess at this time: pt sleeping                                          Interventions offered during this visit: (See comments for more details)    Patient Interventions: Other (comment)(Pt preferred to rest at this time)           Plan of Care:     []  Support spiritual and/or cultural needs    []  Support AMD and/or advance care planning process      []  Support grieving process   []  Coordinate Rites and/or Rituals    []  Coordination with community clergy   []  No spiritual needs identified at this time   []  Detailed Plan of Care below (See Comments)  []  Make referral to Music Therapy  []  Make referral to Pet Therapy     []  Make referral to Addiction services  []  Make referral to [facility] Passages  []  Make referral to Spiritual Care Partner  []  No future visits requested        [x]  Follow up visits as needed     Comments:

## 2018-06-07 NOTE — ACP (Advance Care Planning) (Signed)
Primary Decision Maker: Mulani, [place]bro - [phone]  Advance Care Planning 06/07/2018   Patient's Healthcare Decision Maker is: Verbal statement (Legal Next of Kin remains as decision maker)   Confirm Advance Directive Yes, not on file   Does the patient have other document types Do Not Resuscitate     Pt does not have AMD on file but stated she completed document while living in [place], stated son Lorin Picket has a copy.  Pt reports she appointed Lorin Picket as her Medical POA, could not recall if she appointed a Microbiologist POA.  Pt was clear that she would not want attempts at resuscitation in the event of cardiac/respiratory arrest, stated she would not want to be on a ventilator, would not want a feeding tube, would not want "any tube of any kind."  Durable Do Not Resuscitate form was reviewed and completed.  Copy was placed on chart to be scanned into medical record.  Original and copy were returned to pt with instructions to post form in a visible location of the home such as on the refrigerator or bedroom door/wall.    Addendum:  Family provided copy of POA document completed 01/04/2007 which appoints son Lisa Fauntleroy as sole Medical and Financial POA.  Copy was placed on chart to be scanned into medical record.

## 2018-06-07 NOTE — Consults (Signed)
Palliative Medicine Consult  [place]: [phone])    Patient Name: Jenny Rivera  Date of Birth: 21-Jul-1945    Date of Initial Consult: 06/07/2018  Reason for Consult: Care decisions  Requesting Provider: Dr. Estell Harpin  Primary Care Physician: Merrilee Jansky, MD     SUMMARY:   Jenny Rivera is a 73 y.o. 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 Current medical issues leading to Palliative Medicine involvement include: Care decisions.    Chart reviewed???patient is a 73 year old female with metastatic right breast cancer???appears to have metastatic disease to the mediastinum, thoracic spine, skull as well as advanced heart failure with an echocardiogram from a year ago showing an EF of 20%.  Patient was receiving her care in [facility] but moved in with her son/Scott on 4/18.  She has had significant issues with poor p.o. intake over the last couple weeks.  According to the patient and then confirmed with daughter-in-law, she essentially not eaten anything from was 2 days.  She has been struggling with just no appetite.  Denies any significant abdominal pain.  She does admit to a little bit of right breast discomfort.  Ultimately in the emergency room she is found to have a lactic acidosis with intra-volume volume depletion.  She has been receiving IV fluid overnight.  Our team has been asked to see her for goals of care.    Reviewed scenario with patient.  She is very clear that she elected not to pursue treatment for breast cancer last year secondary to her advanced heart failure.  She also states she has been declining significantly over the last couple weeks with poor p.o. intake and just not feeling hungry.  She moved to [place] to live with her son secondary to his overall  decline.  She states her daughter-in-law???Abigail Butts has been significantly involved in her care.    Social history??? patient has 3 children???Nicki Reaper who lives locally, Timothy-estranged, and Lennette Bihari who lives in [place].         PALLIATIVE DIAGNOSES:   1. Goals of care discussion  2. DNR discussion  3. Anorexia likely secondary to cancer  4. Metastatic breast cancer  5. 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 Advance care planning  7. Debility       PLAN:   1. Melissa, licensed Holiday representative, and I met with patient.  Reviewed the role of palliative medicine in her care and then discussed her current medical issues.  Patient well versed in her medical problems and quite frankly her #1 goal is to return home as quickly as possible.  She is very clear that she knows she is "dying "and wants to spend as much time with her family as possible.  She states her appetite is been very poor over the last couple weeks and she does states she is not hungry.  She really would like for [place] to talk with her daughter-in-law who will be doing the majority of the caregiving for her when she returns home.  She is very interested in pursuing hospice but was not sure how to make those arrangements.  2. Talked with daughter-in-law???Abigail Butts.  Reviewed the role of palliative medicine and then discussed the current medical issues.  Abigail Butts is well versed in the patient's medical problems.  We discussed potential options moving forward to  include hospice.  Abigail Butts actually is the one that suggested hospice to the patient and she was hoping that could be arranged prior to patient being discharged.  Told her we would assist with making those arrangements.  I would have Mendel Ryder from [employer] talk with her shortly so that equipment can be delivered and we will assist with medication management.  She was very thankful that this could be arranged.  We did do some education about end-of-life care and the fact  that her poor p.o. intake likely will continue.  Explained that this does not mean she is "starving "but this is part of the overall dying process.  She was worried that the patient was "starving "and that is why ultimately she came to the hospital last night.  3. Goals of care???focus on comfort and returning home with the support of hospice.  We will assist with making those arrangements.  Have discussed with case management about consult to [facility].  Have discussed with Mendel Ryder???hospice liaison who will verify that we can accept patient and we will work on getting her home hopefully tomorrow.  4. Advance care planning??? Abigail Butts???daughter-in-law was able to email me the advanced medical directive.  This does assign both medical and financial power of attorney to [facility] husband/patient's son???Scott.  We will scan a copy into the chart.  5. CODE STATUS???patient very clear on no attempts at resuscitation.  We completed a durable DO NOT RESUSCITATE form today.  Copy has been placed in the chart and original will go home with patient.  6. Symptom management??? have added Decadron 4 mg daily to see if this helps with both some nausea and appetite.  She has not required any pain medication but clearly will need some when she returns home.  Here in the hospital Norco 5/325 every 4 hours has been ordered.  Once again has not required any medication so far for pain  7. Psychosocial??? patient seems fairly independent on what she wants.  Appreciate her son and daughter-in-law caring for her in the home.  We will continue to support family with hospice.  8. Discussed with bedside nurse, Dr. Estell Harpin, case management, Mendel Ryder???hospice liaison  9. Initial consult note routed to primary continuity provider and/or primary health care team members  10. Communicated plan of care with: Palliative IDT, [place] Team     GOALS OF CARE / TREATMENT PREFERENCES:     GOALS OF CARE:   Patient/Health Care Proxy Stated Goals: Comfort(Hospice care)    TREATMENT PREFERENCES:   Code Status: DNR    Advance Care Planning:  '[x]'  The Pall Med Interdisciplinary Team has updated the ACP Navigator with Health Care Decision Maker and Patient Capacity      Primary Decision Maker: Naarah, [place] - [phone]  Advance Care Planning 06/07/2018   Patient's [place] is: Verbal statement (Legal Next of Kin remains as decision maker)   Confirm Advance Directive Yes, not on file   Does the patient have other document types Do Not Resuscitate       Medical Interventions: Comfort measures     Other Instructions:   Artificially Administered Nutrition: No feeding tube     Other:    As far as possible, the palliative care team has discussed with patient / health care proxy about goals of care / treatment preferences for patient.     HISTORY:     History obtained from: Chart, patient, Abigail Butts???daughter-in-law    CHIEF COMPLAINT:  Weakness    HPI/SUBJECTIVE:    The patient is:   '[x]'  Verbal and participatory  '[]'  Non-participatory due to:   Patient is awake and alert.  Denies any pain at this time.  Continue to receive IV fluids    Clinical Pain Assessment (nonverbal scale for severity on nonverbal patients):   Clinical Pain Assessment  Severity: 2  Location: Right breast and right shoulder  Character: Aching  Duration: Weeks to months  Effect: No significant limitation  Factors: Movement  Frequency: Intermittent          Duration: for how long has pt been experiencing pain (e.g., 2 days, 1 month, years)  Frequency: how often pain is an issue (e.g., several times per day, once every few days, constant)     FUNCTIONAL ASSESSMENT:     Palliative Performance Scale (PPS):  PPS: 60       PSYCHOSOCIAL/SPIRITUAL SCREENING:     Palliative IDT has assessed this patient for cultural preferences / practices and a referral made as appropriate to needs Orthoptist, Patient Advocacy, Ethics, etc.)     Any spiritual / religious concerns:  '[]'  Yes /  '[x]'  No    Caregiver Burnout:  '[]'  Yes /  '[]'  No /  '[x]'  No Caregiver Present      Anticipatory grief assessment:   '[x]'  Normal  / '[]'  Maladaptive       ESAS Anxiety: Anxiety: 0    ESAS Depression: Depression: 0        REVIEW OF SYSTEMS:     Positive and pertinent negative findings in ROS are noted above in HPI.  The following systems were '[x]'  reviewed / '[]'  unable to be reviewed as noted in HPI  Other findings are noted below.  Systems: constitutional, ears/nose/mouth/throat, respiratory, gastrointestinal, genitourinary, musculoskeletal, integumentary, neurologic, psychiatric, endocrine. Positive findings noted below.  Modified ESAS Completed by: provider   Fatigue: 4 Drowsiness: 0   Depression: 0 Pain: 2   Anxiety: 0 Nausea: 3   Anorexia: 5 Dyspnea: 0     Constipation: No              PHYSICAL EXAM:     From RN flowsheet:  Wt Readings from Last 3 Encounters:   06/07/18 145 lb (65.8 kg)   03/16/18 144 lb 8 oz (65.5 kg)   03/12/18 145 lb (65.8 kg)     Blood pressure 114/65, pulse 78, temperature 98.3 ??F (36.8 ??C), resp. rate 16, height '5\' 1"'  (1.549 m), weight 145 lb (65.8 kg), SpO2 96 %.    Pain Scale 1: Numeric (0 - 10)  Pain Intensity 1: 0                 Last bowel movement, if known:     Constitutional: No acute distress, alert and oriented  Eyes: pupils equal, anicteric  ENMT: no nasal discharge, moist mucous membranes  Cardiovascular: regular rhythm, distal pulses intact  Respiratory: breathing not labored, symmetric  Gastrointestinal: soft non-tender, +bowel sounds  Musculoskeletal: no deformity, no tenderness to palpation  Skin: warm, dry  Neurologic: following commands, moving all extremities  Psychiatric: full affect, no hallucinations  Other:       HISTORY:     Active Problems:    Metabolic acidosis (0/45/9977)      Past Medical History:   Diagnosis Date   ??? Cancer ([place])     breast cancer   ??? Heart failure ([place])        History reviewed. No pertinent  surgical history.   Family History   Problem Relation Age of Onset   ??? Hypertension Mother    ??? Heart Disease Father       History reviewed, no pertinent family history.  Social History     Tobacco Use   ??? Smoking status: Never Smoker   ??? Smokeless tobacco: Never Used   Substance Use Topics   ??? Alcohol use: Not Currently     No Known Allergies   Current Facility-Administered Medications   Medication Dose Route Frequency   ??? 0.9% sodium chloride infusion  100 mL/hr IntraVENous CONTINUOUS   ??? glucose chewable tablet 16 g  4 Tab Oral PRN   ??? glucagon (GLUCAGEN) injection 1 mg  1 mg IntraMUSCular PRN   ??? dextrose 10% infusion 0-250 mL  0-250 mL IntraVENous PRN   ??? [START ON 06/08/2018] aspirin delayed-release tablet 81 mg  81 mg Oral DAILY   ??? [START ON 06/08/2018] metoprolol succinate (TOPROL-XL) XL tablet 50 mg  50 mg Oral DAILY   ??? [START ON 06/08/2018] dexAMETHasone (DECADRON) tablet 4 mg  4 mg Oral DAILY   ??? sodium chloride (NS) flush 5-40 mL  5-40 mL IntraVENous Q8H   ??? sodium chloride (NS) flush 5-40 mL  5-40 mL IntraVENous PRN   ??? acetaminophen (TYLENOL) tablet 650 mg  650 mg Oral Q4H PRN   ??? HYDROcodone-acetaminophen (NORCO) 5-325 mg per tablet 1 Tab  1 Tab Oral Q4H PRN   ??? promethazine (PHENERGAN) tablet 25 mg  25 mg Oral Q6H PRN   ??? enoxaparin (LOVENOX) injection 40 mg  40 mg SubCUTAneous Q24H   ??? prochlorperazine (COMPAZINE) injection 10 mg  10 mg IntraVENous Q6H PRN   ??? ondansetron (ZOFRAN) injection 8 mg  8 mg IntraVENous Q8H PRN          LAB AND IMAGING FINDINGS:     Lab Results   Component Value Date/Time    WBC 11.5 (H) 06/07/2018 12:04 AM    HGB 13.4 06/07/2018 12:04 AM    PLATELET 293 06/07/2018 12:04 AM     Lab Results   Component Value Date/Time    Sodium 133 (L) 06/07/2018 12:04 AM    Potassium 3.7 06/07/2018 12:04 AM    Chloride 102 06/07/2018 12:04 AM    CO2 16 (L) 06/07/2018 12:04 AM    BUN 71 (H) 06/07/2018 12:04 AM    Creatinine 1.23 (H) 06/07/2018 12:04 AM     Calcium 8.6 06/07/2018 12:04 AM    Magnesium 2.0 06/07/2018 06:11 AM    Phosphorus 3.1 06/07/2018 12:04 AM      Lab Results   Component Value Date/Time    AST (SGOT) 26 06/07/2018 12:04 AM    Alk. phosphatase 152 (H) 06/07/2018 12:04 AM    Protein, total 6.4 06/07/2018 12:04 AM    Albumin 2.6 (L) 06/07/2018 12:04 AM    Globulin 3.8 06/07/2018 12:04 AM     No results found for: INR, PTMR, PTP, PT1, PT2, APTT, INREXT, INREXT   No results found for: IRON, FE, TIBC, IBCT, PSAT, FERR   Lab Results   Component Value Date/Time    pH 7.37 06/07/2018 09:30 AM    PCO2 24 (L) 06/07/2018 09:30 AM    PO2 104 (H) 06/07/2018 09:30 AM     No components found for: GLPOC   No results found for: CPK, CKMB             Total time: 70  Counseling / coordination time, spent  as noted above: 65  > 50% counseling / coordination?: yes    Prolonged service was provided for  '[]' 30 min   '[]' 75 min in face to face time in the presence of the patient, spent as noted above.  Time Start:   Time End:   Note: this can only be billed with [phone] (initial) or [phone] (follow up).  If multiple start / stop times, list each separately.

## 2018-06-07 NOTE — Progress Notes (Addendum)
Pt does not want blood drawn tonight for labs, unless not doing so would delay her d/c. This RN spoke with Dr. Ermalinda Barrios, who said the labs are unnecessary tonight because pt is discharging to home with hospice care.    4/24 0720:  Bedside shift change report given to Samantha-RN (oncoming nurse) by Jolee Ewing (offgoing nurse). Report included the following information SBAR and MAR.

## 2018-06-07 NOTE — Progress Notes (Addendum)
Shift Change:    Bedside and Verbal shift change report given to Lelon Mast, Charity fundraiser (Cabin crew) by Cordelia Pen, RN (offgoing nurse). Report included the following information SBAR, Kardex, MAR and Recent Results.       0800: Respiratory notified for ABG's.     0930: RT at bedside for ABG. Urine sent to lab      Shift Change:    Bedside and Verbal shift change report given to Sherry,RN (oncoming nurse) by Lelon Mast, RN (offgoing nurse). Report included the following information SBAR, Kardex, MAR and Recent Results.

## 2018-06-07 NOTE — Progress Notes (Addendum)
06/07/2018   CARE MANAGEMENT NOTE:  CM reviewed EMR for clinical updates and handoff received from ER case manager Lynden Ang).  Pt was admitted with metabolic acidosis, and also metastatic breast cancer that she has chosen not to treat.    Reportedly, pt resides with her son Lorin Picket [phone]) and DIL.    RUR 15%    Transition Plan of Care:  1.  Pt will return to the home of son and DIL  2.  Hospice referral made to [facility] and plan admission to home hospice at 3 p.m. on Friday, 4/24.  3.  Outpt f/u  4.  DIL will provide transportation home on Friday at 2 p.m.    CM will continue to follow pt until discharged.  D.Wegner

## 2018-06-07 NOTE — Hospice (Addendum)
[facility]  [facility]  [phone]    Inpatient Nursing PRE Admission   Patient Name: Jenny Rivera  Date of Birth: 11-28-1945  Age: 73 y.o.       Date of PLANNED Hospice Admission: 06/08/2018  Hospice Attending: Dr. Jhonnie Garner  Primary Care Physician: Su Grand, MD     Home Hospice Zip Code: [address]  Expected Case Manager (if patient transferred to Home Hospice): Pranee    ADVANCE CARE PLANNING    Code Status: DNR  Durable DNR: [x]   Yes  []   No  Advance Care Planning 06/07/2018   Patient's Healthcare Decision Maker is: Named in scanned ACP document   Confirm Advance Directive Yes, on file   Does the patient have other document types Do Not Resuscitate       Religion: No religion on file  Funeral Home: TBD    HOSPICE SUMMARY   ER Visits/ Hospitalizations in past year:   Hospice Diagnosis: Breast CA  Onset Date of Hospice Diagnosis: 2019  Summary of Disease Progression Leading to Hospice Diagnosis: Per Palliative MD note on 04/23: Jenny Rivera is a 73 y.o. 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 Current medical issues leading to Palliative Medicine involvement include: Care decisions.Chart reviewed???patient is a 73 year old female with metastatic right breast cancer???appears to have metastatic disease to the mediastinum, thoracic spine, skull as well as advanced heart failure with an echocardiogram from a year ago showing an EF of 20%.  Patient was receiving her care in [facility] but moved in with her son/Scott on 4/18.  She has had significant issues with poor p.o. intake over the last couple weeks.  According to the patient and then confirmed with daughter-in-law, she essentially not eaten anything from was 2 days.  She has been struggling with just no appetite.  Denies any significant  abdominal pain.  She does admit to a little bit of right breast discomfort.  Ultimately in the emergency room she is found to have a lactic acidosis with intra-volume volume depletion.  She has been receiving IV fluid overnight.  Our team has been asked to see her for goals of care. Reviewed scenario with patient.  She is very clear that she elected not to pursue treatment for breast cancer last year secondary to her advanced heart failure.  She also states she has been declining significantly over the last couple weeks with poor p.o. intake and just not feeling hungry.  She moved to [place] to live with her son secondary to his overall decline.  She states her daughter-in-law???Toniann Fail has been significantly involved in her care." Patient and caregiver Toniann Fail have chosen to go home w/ support of hospice care    Co-Morbidities:   Patient Active Problem List   Diagnosis Code   ??? Chronic systolic congestive heart failure (HCC) I50.22   ??? Dilated cardiomyopathy (HCC) I42.0   ??? Essential hypertension I10   ??? Malignant neoplasm of female breast (HCC) C50.919   ??? 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 who was admitted to [facility]. The patient's principle diagnosis of metastatic breast cancer has resulted in pursuit of hospice care.  Functionally, the patient's Palliative Performance Scale has declined over a period of weeks and is estimated at 40. Objective information that support this patients limited prognosis includes: weakness, poor po intake, nausea.  The patient/family chose comfort measures with the support of Hospice.    Patient meets for home routine LOC as evidenced by symptoms able to be managed in the home setting     Verbal certification of terminal diagnosis with life expectancy of 6 months or less received from: Dr. Catalina Pizza    Prognosis estimated based on 06/07/18 clinical assessment is:    Few to Many Hours   Hours to Days    Few to Many Days    Days to Weeks    Few to Many Weeks    Weeks to Months    Few to Many Months    CLINICAL INFORMATION   Allergies: No Known Allergies    Wt Readings from Last 3 Encounters:   06/07/18 65.8 kg (145 lb)   03/16/18 65.5 kg (144 lb 8 oz)   03/12/18 65.8 kg (145 lb)     Ht Readings from Last 3 Encounters:   06/07/18  (1.549 m)   03/16/18  (1.549 m)   03/12/18  (1.549 m)     Body mass index is 27.4 kg/m??.    Visit Vitals  BP 114/65   Pulse 78   Temp 98.3 ??F (36.8 ??C)   Resp 16   Ht  (1.549 m)   Wt 65.8 kg (145 lb)   SpO2 96%   BMI 27.40 kg/m??       LAB VALUES  No results found for this visit on 06/06/18 (from the past 12 hour(s)).  No results found for this visit on 06/06/18 (from the past 6 hour(s)).  Lab Results   Component Value Date/Time    Protein, total 6.4 06/07/2018 12:04 AM    Albumin 2.6 (L) 06/07/2018 12:04 AM       Currently this patient has:   Supplemental O2  Peripheral IV   PICC     PORT    Foley Catheter  NG Tube    PEG Tube  Ostomy     AICD: Has ICD been deactivated?   Yes  No:______    Progression to DEPENDENCE WITH ADLs (include time frame): min-mod assistance w/ ADLs  - Dependent for bathing: personal hygiene and grooming   - Dependent for dressing: dressing and undressing   - Dependent for transferring: movement and mobility   - Dependent for toileting: continence-related tasks including control and hygiene   - Dependent for eating: preparing food and feeding    ASSESSMENT & PLAN     1. Symptom Issues Identified: nausea being managed w/ decadron. Chronic shoulder pain that is managed w/ Tylenol    2. Spiritual Issues Identified: declines chaplain assistance     3.  Psych/ Social/ Emotional Issues Identified: Patient lives w/ son and DIL along w/ 47 year old grandson. Has 2 great-grandchildren who live in [place] near [place]. Tearful when talking about limited time with them.     4.  Care Coordination:   Transfer to: home                  (home or [facility])    Report given to: intake and admission     (  Nurse and MD)    Transportation by:  Toniann FailWendy, wishes to pick up patient at 2pm   Scheduled for 2pm      (time)    Medications Needs: SRK ordered along w/ Decadron for home delivery    DME: per Toniann FailWendy and Okey Regalarol, none needed at this time    Supplies: TBD      COVID screening:  NEGATIVE  Lives w/ son, DIL and 9 year grandson, NO travel in 14 days but [place] from [place] on Sunday. Was living alone at the time and had no known exposures. Family all practicing social distancing. No symptoms, no fevers

## 2018-06-07 NOTE — Progress Notes (Signed)
Verbal shift change report given to Samantha-RN (Cabin crew) by Jolee Ewing (offgoing nurse). Report included the following information SBAR, Intake/Output, MAR and Recent Results.

## 2018-06-07 NOTE — Hospice (Signed)
[facility]  [phone]    Patient Name: Jenny Rivera  Date of Birth: August 19, 1945  Age: 73 y.o.    Hospice Liaison Note:    Symptom Relief Kit and dexamethasone 44m daily ordered from [facility] for delivery to pt's home.  [facility] confirmation # [id number]   Thank you for the opportunity to be of service to this patient.    JDaryll Brod RN-BC  [place]:  [phone] Office:  [phone] [email or web address]

## 2018-06-07 NOTE — Progress Notes (Signed)
Visited by Chaplain Dan Spreacker, BCC, MACM   Chaplain paging service [phone]

## 2018-06-07 NOTE — Progress Notes (Signed)
[facility]  [address] Leonette Mon[place], [place] [address]  [phone]      Medical Progress Note      NAME: Jenny Rivera   DOB:  03/02/1945  MRM:  [id number]    Date/Time: 06/07/2018  2:08 PM         Subjective:     Chief Complaint:  "I just want to go home"     Pt seen and examined. No complaints. Denies vomiting or nausea currently. Intermittent nauseated. No ab pain. Does not want additional work-up. Wants to go home     ROS:  (bold if positive, if negative)      Nausea; intermittent        Objective:       Vitals:          Last 24hrs VS reviewed since prior progress note. Most recent are:    Visit Vitals  BP 114/65   Pulse 78   Temp 98.3 ??F (36.8 ??C)   Resp 16   Ht 5\' 1"  (1.549 m)   Wt 65.8 kg (145 lb)   SpO2 96%   BMI 27.40 kg/m??     SpO2 Readings from Last 6 Encounters:   06/07/18 96%   03/16/18 99%   03/12/18 97%            Intake/Output Summary (Last 24 hours) at 06/07/2018 1408  Last data filed at 06/07/2018 1011  Gross per 24 hour   Intake ???   Output 400 ml   Net -400 ml          Exam:     Physical Exam:    Gen:  Well-developed, well-nourished, in no acute distress  HEENT:  Pink conjunctivae, PERRL, hearing intact to voice, moist mucous membranes  Neck:  Supple, without masses, thyroid non-tender  Resp:  No accessory muscle use, clear breath sounds without wheezes rales or rhonchi  Card:  No murmurs, normal S1, S2 without thrills, bruits or peripheral edema  Abd:  Soft, non-tender, non-distended, normoactive bowel sounds are present  Musc:  No cyanosis or clubbing  Skin:  No rashes or ulcers, skin turgor is good  Neuro:  Cranial nerves 3-12 are grossly intact, grip strength is 5/5 bilaterally and dorsi / plantarflexion is 5/5 bilaterally, follows commands appropriately  Psych:  Good insight, oriented to person, place and time, alert    Medications Reviewed: (see below)    Lab Data Reviewed: (see below)    ______________________________________________________________________     Medications:     Current Facility-Administered Medications   Medication Dose Route Frequency   ??? 0.9% sodium chloride infusion  100 mL/hr IntraVENous CONTINUOUS   ??? glucose chewable tablet 16 g  4 Tab Oral PRN   ??? glucagon (GLUCAGEN) injection 1 mg  1 mg IntraMUSCular PRN   ??? dextrose 10% infusion 0-250 mL  0-250 mL IntraVENous PRN   ??? [START ON 06/08/2018] aspirin delayed-release tablet 81 mg  81 mg Oral DAILY   ??? [START ON 06/08/2018] metoprolol succinate (TOPROL-XL) XL tablet 50 mg  50 mg Oral DAILY   ??? [START ON 06/08/2018] dexAMETHasone (DECADRON) tablet 4 mg  4 mg Oral DAILY   ??? sodium chloride (NS) flush 5-40 mL  5-40 mL IntraVENous Q8H   ??? sodium chloride (NS) flush 5-40 mL  5-40 mL IntraVENous PRN   ??? acetaminophen (TYLENOL) tablet 650 mg  650 mg Oral Q4H PRN   ??? HYDROcodone-acetaminophen (NORCO) 5-325 mg per tablet 1 Tab  1 Tab Oral Q4H PRN   ??? promethazine (PHENERGAN) tablet 25 mg  25 mg Oral Q6H PRN   ??? enoxaparin (LOVENOX) injection 40 mg  40 mg SubCUTAneous Q24H   ??? prochlorperazine (COMPAZINE) injection 10 mg  10 mg IntraVENous Q6H PRN   ??? ondansetron (ZOFRAN) injection 8 mg  8 mg IntraVENous Q8H PRN            Lab Review:     Recent Labs     06/07/18  0004 06/06/18  1330 06/05/18  1500   WBC 11.5* 14.8* 16.0*   HGB 13.4 14.7 13.8   HCT 38.9 42.5 41.4   PLT 293 350 316     Recent Labs     06/07/18  0611 06/07/18  0004 06/06/18  1330 06/05/18  1500   NA  --  133* 129* 129*   K  --  3.7 3.4* 3.5   CL  --  102 100 99   CO2  --  16* 12* 13*   GLU  --  198* 182* 192*   BUN  --  71* 88* 106*   CREA  --  1.23* 1.51* 1.59*   CA  --  8.6 9.6 9.1   MG 2.0  --   --  2.6*   PHOS  --  3.1  --  5.2*   ALB  --  2.6* 3.1* 3.0*   SGOT  --  26 34 34   ALT  --  24 30 30      No components found for: GLPOC         Assessment / Plan:   Nausea/poor PO intake - likely 2/2 underlying cancer. No ab pain and does not want additional imaging. Lipase WNL. Possible that pt has brain mets  -anti-emetics PRN   -IVF's    -diet as tolerated     Metastatic breast CA   -not actively undergoing treatment     Systolic CHF - currently IVVD   -hold  ARB, lasix, spironolactone and metoprolol due to marginal BP's and AKI     AKI - likely 2/2 IVVD  -IVF's  -hold diuretics    Metabolic acidosis/lactic acidosis - ?2/2 IVVD. ?2/2 poor cardiac output. ?2/2 intra-ab path  -pt does not have ab pain and does not want additional imaging   -IVF's   -consider starting PO bicarb but may d/c on hospice     Dispo   -likely hospice in the AM  -DNR    Total time spent with patient: 8035 Minutes                  Care Plan discussed with: Patient and Palliative     Discussed:  Code Status, Care Plan and D/C Planning    Prophylaxis:  Hep SQ    Disposition:  Home w/Family           ___________________________________________________    Attending Physician: Gaspar BiddingLarissa N Sheikh Leverich, MD

## 2018-06-07 NOTE — Progress Notes (Signed)
Palliative Medicine      Code Status: DNR    Advance Care Planning:  Advance Care Planning 06/07/2018   Patient's Healthcare Decision Maker is: Named in scanned ACP document   Confirm Advance Directive Yes, on file   Does the patient have other document types Do Not Resuscitate       Patient / Family Encounter Documentation    Participants (names): Pt, Palliative Medicine (Dr. Catalina Pizza, LCSW Melissa)    Narrative:  Pt was awake in bed, alert and pleasant.  Pt is divorced, has three adult sons.  Son [place] lives in [place], sons Caryn Bee (whom pt described as "a nervous Nelly") and Marcial Pacas (from whom pt is estranged) reside in [place].  Pt also has several grand and great-grandchildren.  Pt had been living outside [place], [place] but has been staying with Lorin Picket, dtr-in-law Toniann Fail, and 32 yr old grandson for the past 6+ weeks, officially moved here on 4/18 after selling her home in [place].  Pt was diagnosed with metastatic breast cancer in February 2019, stated her Cardiologist did not feel she would be able to tolerate treatment for her cancer due to cardiac issues.  Pt stated she had not been expected to see her 73nd birthday but reports she was actually doing well until recently, expressed belief that she is "going downhill rapidly."  Pt reports poor intake over the past weeks, reports overall just not feeling well.    Pt does not have AMD on file but stated she completed document while living in [place], stated son Lorin Picket has a copy.  Pt reports she appointed Lorin Picket as her Medical POA, could not recall if she appointed a Microbiologist POA.  Pt was clear that she would not want attempts at resuscitation in the event of cardiac/respiratory arrest, stated she would not want to be on a ventilator, would not want a feeding tube, would not want "any tube of any kind."  Durable Do Not Resuscitate form was reviewed and completed.  Copy was placed on chart to be scanned into medical  record.  Original and copy were returned to pt with instructions to post form in a visible location of the home such as on the refrigerator or bedroom door/wall.  ??  Addendum:  Family provided copy of POA document completed 01/04/2007 which appoints son Hallye Blandin as sole Medical and Financial POA.  Copy was placed on chart to be scanned into medical record.  ??  Psychosocial Issues Identified/ Resilience Factors:  Pt greatly values her independence, has now moved in with family after selling her home and [place]oc[place] from [place].  Pt does not have a relationship with one son but finds great comfort and joy in her grand and great-grandchildren.  No spiritual concerns identified at this time.    Goals of Care / Plan: Dr. Catalina Pizza spoke with dtr-in-law Toniann Fail by phone per pt's request.  [facility] is making arrangements for pt to return home tomorrow with hospice support.  DDNR has been completed in preparation for discharge.  Attending Physician was updated re: above.  SW will follow for support while pt is inpt.    Thank you for including Palliative Medicine in the care of Ms. Deharo.    [address] [place], LCSW, ACHP-SW  [phone])

## 2018-06-08 ENCOUNTER — Encounter: Primary: Internal Medicine

## 2018-06-08 LAB — CULTURE, URINE: Culture result:: NO GROWTH

## 2018-06-08 MED ORDER — PROMETHAZINE 25 MG TAB
25 mg | ORAL_TABLET | Freq: Four times a day (QID) | ORAL | 0 refills | Status: AC | PRN
Start: 2018-06-08 — End: ?

## 2018-06-08 MED ORDER — ONDANSETRON 8 MG TAB, RAPID DISSOLVE
8 mg | ORAL_TABLET | Freq: Three times a day (TID) | ORAL | 0 refills | Status: AC | PRN
Start: 2018-06-08 — End: ?

## 2018-06-08 MED ORDER — DEXAMETHASONE 4 MG TAB
4 mg | ORAL_TABLET | Freq: Every day | ORAL | 0 refills | Status: DC
Start: 2018-06-08 — End: 2018-10-30

## 2018-06-08 MED ORDER — HYDROCODONE-ACETAMINOPHEN 5 MG-325 MG TAB
5-325 mg | ORAL_TABLET | ORAL | 0 refills | Status: AC | PRN
Start: 2018-06-08 — End: 2018-06-11

## 2018-06-08 MED FILL — METOPROLOL SUCCINATE SR 50 MG 24 HR TAB: 50 mg | ORAL | Qty: 1

## 2018-06-08 MED FILL — SODIUM BICARBONATE 650 MG TAB: 650 mg | ORAL | Qty: 1

## 2018-06-08 MED FILL — ASPIRIN 81 MG TAB, DELAYED RELEASE: 81 mg | ORAL | Qty: 1

## 2018-06-08 MED FILL — DEXAMETHASONE 4 MG TAB: 4 mg | ORAL | Qty: 1

## 2018-06-08 NOTE — Discharge Summary (Addendum)
Discharge Summary       PATIENT ID: Jenny PearsonCarol Rivera  MRN: [id number]   DATE OF BIRTH: 08/06/1945    DATE OF ADMISSION: 06/06/2018  1:06 PM    DATE OF DISCHARGE: 06/08/2018  PRIMARY CARE PROVIDER: Su GrandSimpliciano, Jennifer, MD     ATTENDING PHYSICIAN: Clent RidgesMaitri Shah, MD  DISCHARGING PROVIDER: Clent RidgesMaitri Shah, MD    To contact this individual call [phone] and ask the operator to page.  If unavailable ask to be transferred the Adult Hospitalist Department.    CONSULTATIONS: IP CONSULT TO PALLIATIVE CARE - PROVIDER    PROCEDURES/SURGERIES: * No surgery found *    ADMITTING DIAGNOSES & HOSPITAL COURSE:   73 yo female with a history of metastatic breast cancer which she has elected to leave untreated was admitted with decreased oral intake, metabolic acidosis, lactic acidosis and electrolyte imbalance. She was started on IVF and antiemetics, and her metabolic abnormalities improved. Palliative care was consulted regarding decisions for her terminal illness, and patient decided on hospice care. She will be getting home hospice services starting today. Appetite improved slightly on day of discharge.       DISCHARGE DIAGNOSES / PLAN:      1.  Poor oral intake  2. Metastatic breast cancer  3. Systolic CHF  4. AKI poa  5. Metabolic acidosis  6. Lactic acidosis     ADDITIONAL CARE RECOMMENDATIONS:   Discharge to hospice care. Continue taking zofran, phenergan for nausea as needed. Take dexamethasone daily. Norco for pain if needed.     PENDING TEST RESULTS:   At the time of discharge the following test results are still pending: none    FOLLOW UP APPOINTMENTS:    Follow-up Information     Follow up With Specialties Details Why Contact Info    Su GrandSimpliciano, Jennifer, MD Internal Medicine In 1 week hospital discharge follow up [address]  [address]  [place] [id number]  [phone]               DIET: regular    ACTIVITY: as tolerated      DISCHARGE MEDICATIONS:  Current Discharge Medication List       START taking these medications    Details   dexAMETHasone (DECADRON) 4 mg tablet Take 4 mg by mouth daily.  Qty: 30 Tab, Refills: 0      HYDROcodone-acetaminophen (NORCO) 5-325 mg per tablet Take 1 Tab by mouth every four (4) hours as needed for Pain for up to 3 days. Max Daily Amount: 6 Tabs.  Qty: 30 Tab, Refills: 0    Associated Diagnoses: Metastatic breast cancer (HCC); Malignant neoplasm of female breast, unspecified estrogen receptor status, unspecified laterality, unspecified site of breast (HCC)      promethazine (PHENERGAN) 25 mg tablet Take 1 Tab by mouth every six (6) hours as needed for Nausea.  Qty: 30 Tab, Refills: 0      ondansetron (ZOFRAN ODT) 8 mg disintegrating tablet Take 1 Tab by mouth every eight (8) hours as needed for Nausea or Vomiting.  Qty: 30 Tab, Refills: 0         CONTINUE these medications which have NOT CHANGED    Details   potassium chloride (Klor-Con M10) 10 mEq tablet Take 10 mEq by mouth two (2) times a day.      spironolactone (ALDACTONE) 25 mg tablet Take 0.5 Tabs by mouth daily.  Qty: 45 Tab, Refills: 1      metoprolol succinate (TOPROL-XL) 50 mg XL tablet Take  1 Tab by mouth daily.  Qty: 90 Tab, Refills: 1      irbesartan (AVAPRO) 75 mg tablet Take 1 Tab by mouth nightly.  Qty: 90 Tab, Refills: 1      furosemide (LASIX) 40 mg tablet Take 1 Tab by mouth daily.  Qty: 90 Tab, Refills: 1      aspirin delayed-release 81 mg tablet Take 81 mg by mouth daily.         STOP taking these medications       ondansetron hcl (ZOFRAN) 4 mg tablet Comments:   Reason for Stopping:                 NOTIFY YOUR PHYSICIAN FOR ANY OF THE FOLLOWING:   Fever over 101 degrees for 24 hours.   Chest pain, shortness of breath, fever, chills, nausea, vomiting, diarrhea, change in mentation, falling, weakness, bleeding. Severe pain or pain not relieved by medications.  Or, any other signs or symptoms that you may have questions about.    DISPOSITION:    Home With:   OT  PT  HH  RN        Long term SNF/Inpatient Rehab    Independent/assisted living   x Hospice    Other:       PATIENT CONDITION AT DISCHARGE:     Functional status    Poor    x Deconditioned     Independent      Cognition    x Lucid     Forgetful     Dementia      Catheters/lines (plus indication)    Foley     PICC     PEG    x None      Code status     Full code    x DNR      PHYSICAL EXAMINATION AT DISCHARGE:  General:          Alert, cooperative, no distress, appears stated age.     HEENT:           Atraumatic, anicteric sclerae, pink conjunctivae                          No oral ulcers, mucosa moist, throat clear, dentition fair  Neck:               Supple, symmetrical  Lungs:             Clear to auscultation bilaterally.  No Wheezing or Rhonchi. No rales.  Chest wall:      No tenderness  No Accessory muscle use.  Heart:              Regular  rhythm,  No  murmur   No edema  Abdomen:        Soft, non-tender. Not distended.  Bowel sounds normal  Extremities:     No cyanosis.  No clubbing,                            Skin turgor normal, Capillary refill normal  Skin:                Not pale.  Not Jaundiced  No rashes   Psych:             Not anxious or agitated.  Neurologic:      Alert, moves all extremities, answers questions appropriately and responds to commands  CHRONIC MEDICAL DIAGNOSES:  Problem List as of 06/08/2018 Date Reviewed: 06/05/2018          Codes Class Noted - Resolved    Metabolic acidosis ICD-10-CM: E87.2  ICD-9-CM: 276.2  06/06/2018 - Present        Chronic systolic congestive heart failure (HCC) ICD-10-CM: I50.22  ICD-9-CM: 428.22, 428.0  03/12/2018 - Present    Overview Signed 06/06/2018  8:20 AM by Su Grand, MD     EF 20-25%             Dilated cardiomyopathy (HCC) ICD-10-CM: I42.0  ICD-9-CM: 425.4  03/12/2018 - Present        Essential hypertension ICD-10-CM: I10  ICD-9-CM: 401.9  03/12/2018 - Present        Malignant neoplasm of female breast [facility]) ICD-10-CM: C50.919   ICD-9-CM: 174.9  03/12/2018 - Present              Greater than 20 minutes were spent with the patient on counseling and coordination of care    Signed:   Clent Ridges, MD  06/08/2018  10:55 AM

## 2018-06-08 NOTE — Other (Deleted)
Patient admitted with AKI, metabolic acidosis,  And advanced cardiomyopathy   noted to have elevated troponin's.     If possible, please document in progress notes and d/c summary the cause of the elevated troponin's If known.     => Type 2 MI due to demand ischemia 2/2 AKI and advanced cardiomyopathy  => Elevated troponin's not clinically significant   => Other Explanation of clinical findings  => Clinically Undetermined (no explanation for clinical findings)    The medical record reflects the following:     Risk Factors: 73 yo F admitted with AKI, metabolic acidosis, & advanced cardiomyopathy     Clinical Indicators: 4/22 EKG shows LVH, Inferior infarct age unknown      troponin's .29 .20 .15 .14      Treatment: trend troponin's, palliative consult     Thank you for your time   [facility] RN/BSN, CCDS  Desk:   [phone]   Other:  [phone]      ? Type 2 (MI secondary to an ischemic imbalance): MI consequent to increased oxygen demand or decreased supply (eg, coronary endothelial dysfunction, coronary artery spasm, coronary artery embolus, tachy-/brady-arrhythmias, anemia, respiratory failure, hypertension, or hypotension).                                               Reference    Reeder MD, Mellissa Kohut., & Kyung Rudd MD MPH, Kevan Ny. (August 19, 2014). Criteria for the Diagnosis of Acute Myocardial Infarction. In UpToDate (Topic  52, Version 36.0). Retrieved from NastyThought.uy

## 2018-06-08 NOTE — Other (Signed)
Goals of Care/Treatment Preferences    The Palliative Medicine team was consulted as part of your/your loved one's care in the hospital. Our team is a supportive service; we strive to relieve suffering and improve quality of life.    We reviewed advance care planning information, which includes the following:  Patient's Healthcare Decision Maker is:: Named in scanned ACP document  Confirm Advance Directive: Yes, on file  Does the patient have other document types: Do Not Resuscitate    Patient/Health Care Proxy Stated Goals: Comfort(Hospice care)    We reviewed / discussed your code status as: DNR     ???Full Code??? means perform CPR in the event of cardiac arrest.      ???DNR??? means do NOT perform CPR in the event of cardiac arrest.      ???Partial Code??? means you have specific preferences, please discuss with your healthcare team.      ???No Order??? means this issue was not addressed / resolved during your stay    Medical Interventions: Comfort measures     Other Instructions: You will be returning home with support from [facility].  You have a Durable Do Not Resuscitate Order in place, which should travel with you.  When you are in a facility, this form should be placed on your chart. Once you are home, it is recommended that the [facility] form be placed in a visible location such as on the refrigerator or bedroom door.    Artificially Administered Nutrition: No feeding tube    Because of the importance of this information, we are providing you with a printed copy to share with other healthcare providers after this hospitalization is complete.

## 2018-06-08 NOTE — Progress Notes (Addendum)
Shift Change:    Bedside and Verbal shift change report given to Lelon Mast, Charity fundraiser (Cabin crew) by Cordelia Pen, RN (offgoing nurse). Report included the following information SBAR, Kardex, MAR and Recent Results.     62: Patient refused AM labs.

## 2018-06-08 NOTE — Other (Deleted)
Pt admitted with metabolic acidosis and has CHF documented. Please further specify type and acuity of CHF and the etiology  in the medical record.           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 ?? Mild aortic valve sclerosis.  Moderate mitral valve regurgitation     Treatment: -hold  ARB, lasix, spironolactone and metoprolol due to marginal BP's and AKI     Thank you for your time   [facility] RN/BSN, CCDS  Desk:   [phone]   Other:  [phone]

## 2018-06-08 NOTE — Progress Notes (Signed)
Spiritual Care Assessment/Progress Note  [facility]      NAME: Jenny PearsonCarol Bebee      MRN: [id number]  AGE: 73 y.o. SEX: female  Religious Affiliation:    Language: English     06/08/2018     Total Time (in minutes): 7     [facility] through conversation with:         [x] Patient        []  Family    []  Friend(s)        Reason for Consult: Palliative Care, Initial/Spiritual Assessment     Spiritual beliefs: (Please include comment if needed)     []  Identifies with a faith tradition:         []  Supported by a faith community:            []  Claims no spiritual orientation:           []  Seeking spiritual identity:                [x]  Adheres to an individual form of spirituality:           []  Not able to assess:                           Identified resources for coping:      []  Prayer                               []  Music                  []  Guided Imagery     [x]  Family/friends                 []  Pet visits     []  Devotional reading                         []  Unknown     []  Other:                                              Interventions offered during this visit: (See comments for more details)    Patient Interventions: Initial/Spiritual assessment, patient floor, Normalization of emotional/spiritual concerns, Affirmation of emotions/emotional suffering, Prayer (assurance of)           Plan of Care:     []  Support spiritual and/or cultural needs    []  Support AMD and/or advance care planning process      []  Support grieving process   []  Coordinate Rites and/or Rituals    []  Coordination with community clergy   []  No spiritual needs identified at this time   []  Detailed Plan of Care below (See Comments)  []  Make referral to Music Therapy  []  Make referral to Pet Therapy     []  Make referral to Addiction services  []  [facility]  []  Make referral to Spiritual Care Partner  [x]  No future visits requested        []  Follow up visits as needed      Comments: Pt indicated she has no need for a chaplain. Expressed she has received all of her graces.    Chaplain visited  in response to Palliative Care consult in Med Surg 2. Patient was present and engaged during the visit.  Patient shared briefly about her spirituality and said she has had her graces and has no need for a chaplain. Patient shared about importance of her family.  Chaplain provided reflective listening, offered God's blessings, advised of chaplain availability. Pt requested no further chaplain visits.      Visited by Burnis Medin, [facility], [facility]   Chaplain paging service [phone])

## 2018-06-08 NOTE — Progress Notes (Signed)
Pharmacist Discharge Medication Reconciliation    Discharge Provider:  Dr. Sherryll Burger       Discharge Medications:      My Medications        START taking these medications        Instructions Each Dose to Equal Morning Noon Evening Bedtime   dexAMETHasone 4 mg tablet  Commonly known as:  DECADRON      Take 4 mg by mouth daily.   4 mg         HYDROcodone-acetaminophen 5-325 mg per tablet  Commonly known as:  NORCO      Take 1 Tab by mouth every four (4) hours as needed for Pain for up to 3 days. Max Daily Amount: 6 Tabs.   1 Tab         ondansetron 8 mg disintegrating tablet  Commonly known as:  ZOFRAN ODT      Take 1 Tab by mouth every eight (8) hours as needed for Nausea or Vomiting.   8 mg         promethazine 25 mg tablet  Commonly known as:  PHENERGAN      Take 1 Tab by mouth every six (6) hours as needed for Nausea.   25 mg                CONTINUE taking these medications        Instructions Each Dose to Equal Morning Noon Evening Bedtime   aspirin delayed-release 81 mg tablet      Take 81 mg by mouth daily.   81 mg         furosemide 40 mg tablet  Commonly known as:  LASIX      Take 1 Tab by mouth daily.   40 mg         irbesartan 75 mg tablet  Commonly known as:  AVAPRO      Take 1 Tab by mouth nightly.   75 mg         Klor-Con M10 10 mEq tablet  Generic drug:  potassium chloride      Take 10 mEq by mouth two (2) times a day.   10 mEq         metoprolol succinate 50 mg XL tablet  Commonly known as:  TOPROL-XL      Take 1 Tab by mouth daily.   50 mg         spironolactone 25 mg tablet  Commonly known as:  ALDACTONE      Take 0.5 Tabs by mouth daily.   12.5 mg                STOP taking these medications      ondansetron hcl 4 mg tablet  Commonly known as:  ZOFRAN                  Where to Get Your Medications        Information on where to get these meds will be given to you by the nurse or doctor.    Ask your nurse or doctor about these medications  dexAMETHasone 4 mg tablet   HYDROcodone-acetaminophen 5-325 mg per tablet  ondansetron 8 mg disintegrating tablet  promethazine 25 mg tablet       Vanice Sarah, Pharmacist

## 2018-06-08 NOTE — Other (Deleted)
Documentation of "AKI??? is noted in the progress notes. Currently the patient does not meet RIFLE criteria (BSV approved) to support this diagnosis. If you are using another criteria to support this diagnosis, please document this in your progress note. Otherwise, please document in the progress notes the clinical indicators that support this diagnosis or state that the diagnosis has been ruled out.    => Confirmation of AKI POA (list supporting criteria)  => AKI POA ruled out  => Other explanation   => Unable to determine     The medical record reflects the following:       Risk Factors: 73 yo F with  has metastatic breast cancer, advanced cardiomyopathy was admitted FTT, Dehydration, metabolic acidosis and AKI.      Clinical Indicators: H&P states "Acute  kidney injury???From dehydration,??? Fluids and monitor"   4/22 Creatinine 1.59 GFR 32   4/24 Creatinine 1.23 GFR 43  Treatment: IV 500 cc NS bolus, DC to hospice     Thank you for your time   [facility] RN/BSN, CCDS  Desk:   [phone]   Other:  [phone]      RIFLE  (BSV Approved)    RISK:  Increased SCr x 1.5 or GFR decrease > 25% (within 7 days)    INJURY:  Increased SCr x 2.0 or GFR decreased > 50%    FAILURE:  Increased SCr x 3.0 or GFR decrease > 75% or SCr >4.0 mg/dL or acute increase >4.7 mg/dL    LOSS:  Persistent acute renal failure = complete loss of kidney function > 4 weeks    END STAGE:  End stage of kidney disease > 3 months      AKIN    STAGE  1:  Increase in SCr >/= 0.3 mg/dL or >/= 829% to 562% (1.5 to 2-fold) from baseline (within 48 hours)    STAGE  2:  Increase in SCr to more than 200% to 300% (>2-3 fold) from baseline    STAGE  3:  Increase in SCr to more than 300% (>3-fold) from baseline or SCr >/= 4.0 mg/dL with an acute increase of at least 0.5 mg/dL or initiation of renal replacement therapy      KDIGO    STAGE  1:  Increase in SCr by >/= 0.3 mg/dL within 48 hours or increase in  SCr 1.5 to 1.9 times baseline which is known or presumed to have occurred within the prior 7 days    STAGE  2:  Increase in SCr to 2.0 to 2.9 times baseline    STAGE  3:  Increase in SCr to 3.0 times baseline or increase in SCr to >/= baseline or increase in SCr to >/= 4.0 mg/dL or initiation of renal replacement therapy

## 2018-06-08 NOTE — Progress Notes (Signed)
06/08/2018   CARE MANAGEMENT NOTE:  CM reviewed EMR for clinical updates. Pt was admitted with metabolic acidosis, and also metastatic breast cancer that she has chosen not to treat.    Reportedly, pt resides with her son Lorin Picket [phone]) and DIL.  ??  RUR 18%  ??  Transition Plan of Care:  1.  Pt will return to the home of son and DIL and will admit to [facility] at 3 p.m. on Friday, 4/24.  2.  DIL will provide transportation home on Friday at 2 p.m.  ??  CM will continue to follow pt until discharged.  D.Wegner

## 2018-06-08 NOTE — Progress Notes (Signed)
Patient given discharge instructions and prescriptions. PIV removed. Patient verbalizes understanding of discharge instructions and palliative home care. Patient states she will dress herself. Patient e-signed discharge instructions. Primary nurse made aware.

## 2018-06-11 ENCOUNTER — Encounter: Admit: 2018-06-11 | Discharge: 2018-06-11 | Payer: MEDICARE | Primary: Internal Medicine

## 2018-06-11 DIAGNOSIS — I509 Heart failure, unspecified: Secondary | ICD-10-CM | POA: Diagnosis not present

## 2018-06-11 DIAGNOSIS — E872 Acidosis: Secondary | ICD-10-CM | POA: Diagnosis not present

## 2018-06-11 DIAGNOSIS — I5022 Chronic systolic (congestive) heart failure: Secondary | ICD-10-CM | POA: Diagnosis not present

## 2018-06-11 DIAGNOSIS — I420 Dilated cardiomyopathy: Secondary | ICD-10-CM | POA: Diagnosis not present

## 2018-06-11 DIAGNOSIS — Z515 Encounter for palliative care: Secondary | ICD-10-CM | POA: Diagnosis not present

## 2018-06-11 DIAGNOSIS — C50919 Malignant neoplasm of unspecified site of unspecified female breast: Secondary | ICD-10-CM | POA: Diagnosis not present

## 2018-06-11 DIAGNOSIS — I10 Essential (primary) hypertension: Secondary | ICD-10-CM | POA: Diagnosis not present

## 2018-06-12 ENCOUNTER — Encounter: Primary: Internal Medicine

## 2018-06-13 ENCOUNTER — Encounter: Attending: Student in an Organized Health Care Education/Training Program | Primary: Internal Medicine

## 2018-06-13 ENCOUNTER — Encounter: Primary: Internal Medicine

## 2018-06-14 ENCOUNTER — Encounter: Primary: Internal Medicine

## 2018-06-15 ENCOUNTER — Encounter: Admit: 2018-06-15 | Discharge: 2018-06-15 | Payer: MEDICARE | Primary: Internal Medicine

## 2018-06-15 DIAGNOSIS — I420 Dilated cardiomyopathy: Secondary | ICD-10-CM | POA: Diagnosis not present

## 2018-06-15 DIAGNOSIS — I5022 Chronic systolic (congestive) heart failure: Secondary | ICD-10-CM | POA: Diagnosis not present

## 2018-06-15 DIAGNOSIS — Z515 Encounter for palliative care: Secondary | ICD-10-CM | POA: Diagnosis not present

## 2018-06-15 DIAGNOSIS — I509 Heart failure, unspecified: Secondary | ICD-10-CM | POA: Diagnosis not present

## 2018-06-15 DIAGNOSIS — C50919 Malignant neoplasm of unspecified site of unspecified female breast: Secondary | ICD-10-CM | POA: Diagnosis not present

## 2018-06-15 DIAGNOSIS — E872 Acidosis: Secondary | ICD-10-CM | POA: Diagnosis not present

## 2018-06-15 DIAGNOSIS — I10 Essential (primary) hypertension: Secondary | ICD-10-CM | POA: Diagnosis not present

## 2018-06-18 ENCOUNTER — Encounter: Admit: 2018-06-18 | Discharge: 2018-06-18 | Payer: MEDICARE | Primary: Internal Medicine

## 2018-06-18 DIAGNOSIS — I5022 Chronic systolic (congestive) heart failure: Secondary | ICD-10-CM | POA: Diagnosis not present

## 2018-06-18 DIAGNOSIS — E872 Acidosis: Secondary | ICD-10-CM | POA: Diagnosis not present

## 2018-06-18 DIAGNOSIS — I509 Heart failure, unspecified: Secondary | ICD-10-CM | POA: Diagnosis not present

## 2018-06-18 DIAGNOSIS — C50919 Malignant neoplasm of unspecified site of unspecified female breast: Secondary | ICD-10-CM | POA: Diagnosis not present

## 2018-06-18 DIAGNOSIS — I420 Dilated cardiomyopathy: Secondary | ICD-10-CM | POA: Diagnosis not present

## 2018-06-18 DIAGNOSIS — I10 Essential (primary) hypertension: Secondary | ICD-10-CM | POA: Diagnosis not present

## 2018-06-19 DIAGNOSIS — I5022 Chronic systolic (congestive) heart failure: Secondary | ICD-10-CM | POA: Diagnosis not present

## 2018-06-19 DIAGNOSIS — I509 Heart failure, unspecified: Secondary | ICD-10-CM | POA: Diagnosis not present

## 2018-06-19 DIAGNOSIS — E872 Acidosis: Secondary | ICD-10-CM | POA: Diagnosis not present

## 2018-06-19 DIAGNOSIS — C50919 Malignant neoplasm of unspecified site of unspecified female breast: Secondary | ICD-10-CM | POA: Diagnosis not present

## 2018-06-19 DIAGNOSIS — I10 Essential (primary) hypertension: Secondary | ICD-10-CM | POA: Diagnosis not present

## 2018-06-19 DIAGNOSIS — I420 Dilated cardiomyopathy: Secondary | ICD-10-CM | POA: Diagnosis not present

## 2018-06-21 ENCOUNTER — Telehealth: Payer: Self-pay | Admitting: Cardiovascular Disease

## 2018-06-21 NOTE — Telephone Encounter (Signed)
Called patient to schedule f/u with Dr. Acie Fredrickson. She said that she has moved to [place] and has found another cardiologist.  She will not be coming back to [place] to see Dr. Acie Fredrickson.

## 2018-07-02 ENCOUNTER — Encounter: Admit: 2018-07-02 | Discharge: 2018-07-02 | Payer: MEDICARE | Primary: Internal Medicine

## 2018-07-06 ENCOUNTER — Encounter: Admit: 2018-07-06 | Discharge: 2018-07-06 | Payer: MEDICARE | Primary: Internal Medicine

## 2018-07-06 DIAGNOSIS — E872 Acidosis: Secondary | ICD-10-CM | POA: Diagnosis not present

## 2018-07-06 DIAGNOSIS — I5022 Chronic systolic (congestive) heart failure: Secondary | ICD-10-CM | POA: Diagnosis not present

## 2018-07-06 DIAGNOSIS — I420 Dilated cardiomyopathy: Secondary | ICD-10-CM | POA: Diagnosis not present

## 2018-07-06 DIAGNOSIS — C50919 Malignant neoplasm of unspecified site of unspecified female breast: Secondary | ICD-10-CM | POA: Diagnosis not present

## 2018-07-06 DIAGNOSIS — I509 Heart failure, unspecified: Secondary | ICD-10-CM | POA: Diagnosis not present

## 2018-07-06 DIAGNOSIS — I10 Essential (primary) hypertension: Secondary | ICD-10-CM | POA: Diagnosis not present

## 2018-07-16 DIAGNOSIS — I420 Dilated cardiomyopathy: Secondary | ICD-10-CM | POA: Diagnosis not present

## 2018-07-16 DIAGNOSIS — E872 Acidosis: Secondary | ICD-10-CM | POA: Diagnosis not present

## 2018-07-16 DIAGNOSIS — I5022 Chronic systolic (congestive) heart failure: Secondary | ICD-10-CM | POA: Diagnosis not present

## 2018-07-16 DIAGNOSIS — Z515 Encounter for palliative care: Secondary | ICD-10-CM | POA: Diagnosis not present

## 2018-07-16 DIAGNOSIS — I509 Heart failure, unspecified: Secondary | ICD-10-CM | POA: Diagnosis not present

## 2018-07-16 DIAGNOSIS — C50919 Malignant neoplasm of unspecified site of unspecified female breast: Secondary | ICD-10-CM | POA: Diagnosis not present

## 2018-07-16 DIAGNOSIS — I10 Essential (primary) hypertension: Secondary | ICD-10-CM | POA: Diagnosis not present

## 2018-07-23 ENCOUNTER — Encounter: Primary: Internal Medicine

## 2018-07-25 ENCOUNTER — Encounter: Admit: 2018-07-25 | Discharge: 2018-07-25 | Payer: MEDICARE | Primary: Internal Medicine

## 2018-07-25 DIAGNOSIS — E872 Acidosis: Secondary | ICD-10-CM | POA: Diagnosis not present

## 2018-07-25 DIAGNOSIS — C50919 Malignant neoplasm of unspecified site of unspecified female breast: Secondary | ICD-10-CM | POA: Diagnosis not present

## 2018-07-25 DIAGNOSIS — I5022 Chronic systolic (congestive) heart failure: Secondary | ICD-10-CM | POA: Diagnosis not present

## 2018-07-25 DIAGNOSIS — I10 Essential (primary) hypertension: Secondary | ICD-10-CM | POA: Diagnosis not present

## 2018-07-25 DIAGNOSIS — I420 Dilated cardiomyopathy: Secondary | ICD-10-CM | POA: Diagnosis not present

## 2018-07-25 DIAGNOSIS — I509 Heart failure, unspecified: Secondary | ICD-10-CM | POA: Diagnosis not present

## 2018-08-01 DIAGNOSIS — E872 Acidosis: Secondary | ICD-10-CM | POA: Diagnosis not present

## 2018-08-01 DIAGNOSIS — C50919 Malignant neoplasm of unspecified site of unspecified female breast: Secondary | ICD-10-CM | POA: Diagnosis not present

## 2018-08-01 DIAGNOSIS — I420 Dilated cardiomyopathy: Secondary | ICD-10-CM | POA: Diagnosis not present

## 2018-08-01 DIAGNOSIS — I10 Essential (primary) hypertension: Secondary | ICD-10-CM | POA: Diagnosis not present

## 2018-08-01 DIAGNOSIS — I5022 Chronic systolic (congestive) heart failure: Secondary | ICD-10-CM | POA: Diagnosis not present

## 2018-08-01 DIAGNOSIS — I509 Heart failure, unspecified: Secondary | ICD-10-CM | POA: Diagnosis not present

## 2018-08-06 ENCOUNTER — Encounter: Primary: Internal Medicine

## 2018-08-06 ENCOUNTER — Encounter: Attending: Student in an Organized Health Care Education/Training Program | Primary: Internal Medicine

## 2018-08-06 DIAGNOSIS — C50919 Malignant neoplasm of unspecified site of unspecified female breast: Secondary | ICD-10-CM | POA: Diagnosis not present

## 2018-08-06 DIAGNOSIS — I420 Dilated cardiomyopathy: Secondary | ICD-10-CM | POA: Diagnosis not present

## 2018-08-06 DIAGNOSIS — I10 Essential (primary) hypertension: Secondary | ICD-10-CM | POA: Diagnosis not present

## 2018-08-06 DIAGNOSIS — E872 Acidosis: Secondary | ICD-10-CM | POA: Diagnosis not present

## 2018-08-06 DIAGNOSIS — I5022 Chronic systolic (congestive) heart failure: Secondary | ICD-10-CM | POA: Diagnosis not present

## 2018-08-06 DIAGNOSIS — I509 Heart failure, unspecified: Secondary | ICD-10-CM | POA: Diagnosis not present

## 2018-08-07 DIAGNOSIS — I509 Heart failure, unspecified: Secondary | ICD-10-CM | POA: Diagnosis not present

## 2018-08-07 DIAGNOSIS — I5022 Chronic systolic (congestive) heart failure: Secondary | ICD-10-CM | POA: Diagnosis not present

## 2018-08-07 DIAGNOSIS — I10 Essential (primary) hypertension: Secondary | ICD-10-CM | POA: Diagnosis not present

## 2018-08-07 DIAGNOSIS — E872 Acidosis: Secondary | ICD-10-CM | POA: Diagnosis not present

## 2018-08-07 DIAGNOSIS — C50919 Malignant neoplasm of unspecified site of unspecified female breast: Secondary | ICD-10-CM | POA: Diagnosis not present

## 2018-08-07 DIAGNOSIS — I420 Dilated cardiomyopathy: Secondary | ICD-10-CM | POA: Diagnosis not present

## 2018-08-10 ENCOUNTER — Encounter: Admit: 2018-08-10 | Discharge: 2018-08-10 | Payer: MEDICARE | Primary: Internal Medicine

## 2018-08-10 DIAGNOSIS — I5022 Chronic systolic (congestive) heart failure: Secondary | ICD-10-CM | POA: Diagnosis not present

## 2018-08-10 DIAGNOSIS — I10 Essential (primary) hypertension: Secondary | ICD-10-CM | POA: Diagnosis not present

## 2018-08-10 DIAGNOSIS — I509 Heart failure, unspecified: Secondary | ICD-10-CM | POA: Diagnosis not present

## 2018-08-10 DIAGNOSIS — I420 Dilated cardiomyopathy: Secondary | ICD-10-CM | POA: Diagnosis not present

## 2018-08-10 DIAGNOSIS — C50919 Malignant neoplasm of unspecified site of unspecified female breast: Secondary | ICD-10-CM | POA: Diagnosis not present

## 2018-08-10 DIAGNOSIS — E872 Acidosis: Secondary | ICD-10-CM | POA: Diagnosis not present

## 2018-08-13 ENCOUNTER — Encounter: Primary: Internal Medicine

## 2018-08-15 DIAGNOSIS — I509 Heart failure, unspecified: Secondary | ICD-10-CM | POA: Diagnosis not present

## 2018-08-15 DIAGNOSIS — C50919 Malignant neoplasm of unspecified site of unspecified female breast: Secondary | ICD-10-CM | POA: Diagnosis not present

## 2018-08-15 DIAGNOSIS — Z515 Encounter for palliative care: Secondary | ICD-10-CM | POA: Diagnosis not present

## 2018-08-15 DIAGNOSIS — I420 Dilated cardiomyopathy: Secondary | ICD-10-CM | POA: Diagnosis not present

## 2018-08-15 DIAGNOSIS — I5022 Chronic systolic (congestive) heart failure: Secondary | ICD-10-CM | POA: Diagnosis not present

## 2018-08-15 DIAGNOSIS — E872 Acidosis: Secondary | ICD-10-CM | POA: Diagnosis not present

## 2018-08-15 DIAGNOSIS — I10 Essential (primary) hypertension: Secondary | ICD-10-CM | POA: Diagnosis not present

## 2018-08-31 ENCOUNTER — Encounter: Primary: Internal Medicine

## 2018-08-31 DIAGNOSIS — E872 Acidosis: Secondary | ICD-10-CM | POA: Diagnosis not present

## 2018-08-31 DIAGNOSIS — I10 Essential (primary) hypertension: Secondary | ICD-10-CM | POA: Diagnosis not present

## 2018-08-31 DIAGNOSIS — C50919 Malignant neoplasm of unspecified site of unspecified female breast: Secondary | ICD-10-CM | POA: Diagnosis not present

## 2018-08-31 DIAGNOSIS — I5022 Chronic systolic (congestive) heart failure: Secondary | ICD-10-CM | POA: Diagnosis not present

## 2018-08-31 DIAGNOSIS — I420 Dilated cardiomyopathy: Secondary | ICD-10-CM | POA: Diagnosis not present

## 2018-08-31 DIAGNOSIS — I509 Heart failure, unspecified: Secondary | ICD-10-CM | POA: Diagnosis not present

## 2018-09-01 DIAGNOSIS — C50919 Malignant neoplasm of unspecified site of unspecified female breast: Secondary | ICD-10-CM | POA: Diagnosis not present

## 2018-09-01 DIAGNOSIS — I420 Dilated cardiomyopathy: Secondary | ICD-10-CM | POA: Diagnosis not present

## 2018-09-01 DIAGNOSIS — I5022 Chronic systolic (congestive) heart failure: Secondary | ICD-10-CM | POA: Diagnosis not present

## 2018-09-01 DIAGNOSIS — I509 Heart failure, unspecified: Secondary | ICD-10-CM | POA: Diagnosis not present

## 2018-09-01 DIAGNOSIS — I10 Essential (primary) hypertension: Secondary | ICD-10-CM | POA: Diagnosis not present

## 2018-09-01 DIAGNOSIS — E872 Acidosis: Secondary | ICD-10-CM | POA: Diagnosis not present

## 2018-09-03 ENCOUNTER — Encounter: Primary: Internal Medicine

## 2018-09-04 DIAGNOSIS — I420 Dilated cardiomyopathy: Secondary | ICD-10-CM | POA: Diagnosis not present

## 2018-09-04 DIAGNOSIS — I509 Heart failure, unspecified: Secondary | ICD-10-CM | POA: Diagnosis not present

## 2018-09-04 DIAGNOSIS — I10 Essential (primary) hypertension: Secondary | ICD-10-CM | POA: Diagnosis not present

## 2018-09-04 DIAGNOSIS — E872 Acidosis: Secondary | ICD-10-CM | POA: Diagnosis not present

## 2018-09-04 DIAGNOSIS — C50919 Malignant neoplasm of unspecified site of unspecified female breast: Secondary | ICD-10-CM | POA: Diagnosis not present

## 2018-09-04 DIAGNOSIS — I5022 Chronic systolic (congestive) heart failure: Secondary | ICD-10-CM | POA: Diagnosis not present

## 2018-09-08 DIAGNOSIS — C50919 Malignant neoplasm of unspecified site of unspecified female breast: Secondary | ICD-10-CM | POA: Diagnosis not present

## 2018-09-08 DIAGNOSIS — E872 Acidosis: Secondary | ICD-10-CM | POA: Diagnosis not present

## 2018-09-08 DIAGNOSIS — I10 Essential (primary) hypertension: Secondary | ICD-10-CM | POA: Diagnosis not present

## 2018-09-08 DIAGNOSIS — I420 Dilated cardiomyopathy: Secondary | ICD-10-CM | POA: Diagnosis not present

## 2018-09-08 DIAGNOSIS — I5022 Chronic systolic (congestive) heart failure: Secondary | ICD-10-CM | POA: Diagnosis not present

## 2018-09-08 DIAGNOSIS — I509 Heart failure, unspecified: Secondary | ICD-10-CM | POA: Diagnosis not present

## 2018-09-15 DIAGNOSIS — I10 Essential (primary) hypertension: Secondary | ICD-10-CM | POA: Diagnosis not present

## 2018-09-15 DIAGNOSIS — C50919 Malignant neoplasm of unspecified site of unspecified female breast: Secondary | ICD-10-CM | POA: Diagnosis not present

## 2018-09-15 DIAGNOSIS — I509 Heart failure, unspecified: Secondary | ICD-10-CM | POA: Diagnosis not present

## 2018-09-15 DIAGNOSIS — I420 Dilated cardiomyopathy: Secondary | ICD-10-CM | POA: Diagnosis not present

## 2018-09-15 DIAGNOSIS — E872 Acidosis: Secondary | ICD-10-CM | POA: Diagnosis not present

## 2018-09-15 DIAGNOSIS — I5022 Chronic systolic (congestive) heart failure: Secondary | ICD-10-CM | POA: Diagnosis not present

## 2018-09-20 DIAGNOSIS — I420 Dilated cardiomyopathy: Secondary | ICD-10-CM | POA: Diagnosis not present

## 2018-09-20 DIAGNOSIS — I5022 Chronic systolic (congestive) heart failure: Secondary | ICD-10-CM | POA: Diagnosis not present

## 2018-09-20 DIAGNOSIS — I509 Heart failure, unspecified: Secondary | ICD-10-CM | POA: Diagnosis not present

## 2018-09-20 DIAGNOSIS — C50919 Malignant neoplasm of unspecified site of unspecified female breast: Secondary | ICD-10-CM | POA: Diagnosis not present

## 2018-09-20 DIAGNOSIS — I10 Essential (primary) hypertension: Secondary | ICD-10-CM | POA: Diagnosis not present

## 2018-09-20 DIAGNOSIS — E872 Acidosis: Secondary | ICD-10-CM | POA: Diagnosis not present

## 2018-09-27 DIAGNOSIS — I509 Heart failure, unspecified: Secondary | ICD-10-CM | POA: Diagnosis not present

## 2018-09-27 DIAGNOSIS — C50919 Malignant neoplasm of unspecified site of unspecified female breast: Secondary | ICD-10-CM | POA: Diagnosis not present

## 2018-09-27 DIAGNOSIS — I420 Dilated cardiomyopathy: Secondary | ICD-10-CM | POA: Diagnosis not present

## 2018-09-27 DIAGNOSIS — I10 Essential (primary) hypertension: Secondary | ICD-10-CM | POA: Diagnosis not present

## 2018-09-27 DIAGNOSIS — I5022 Chronic systolic (congestive) heart failure: Secondary | ICD-10-CM | POA: Diagnosis not present

## 2018-09-27 DIAGNOSIS — E872 Acidosis: Secondary | ICD-10-CM | POA: Diagnosis not present

## 2018-09-28 DIAGNOSIS — I509 Heart failure, unspecified: Secondary | ICD-10-CM | POA: Diagnosis not present

## 2018-09-28 DIAGNOSIS — I5022 Chronic systolic (congestive) heart failure: Secondary | ICD-10-CM | POA: Diagnosis not present

## 2018-09-28 DIAGNOSIS — I10 Essential (primary) hypertension: Secondary | ICD-10-CM | POA: Diagnosis not present

## 2018-09-28 DIAGNOSIS — C50919 Malignant neoplasm of unspecified site of unspecified female breast: Secondary | ICD-10-CM | POA: Diagnosis not present

## 2018-09-28 DIAGNOSIS — E872 Acidosis: Secondary | ICD-10-CM | POA: Diagnosis not present

## 2018-09-28 DIAGNOSIS — I420 Dilated cardiomyopathy: Secondary | ICD-10-CM | POA: Diagnosis not present

## 2018-10-08 DIAGNOSIS — I509 Heart failure, unspecified: Secondary | ICD-10-CM | POA: Diagnosis not present

## 2018-10-08 DIAGNOSIS — I10 Essential (primary) hypertension: Secondary | ICD-10-CM | POA: Diagnosis not present

## 2018-10-08 DIAGNOSIS — E872 Acidosis: Secondary | ICD-10-CM | POA: Diagnosis not present

## 2018-10-08 DIAGNOSIS — I420 Dilated cardiomyopathy: Secondary | ICD-10-CM | POA: Diagnosis not present

## 2018-10-08 DIAGNOSIS — I5022 Chronic systolic (congestive) heart failure: Secondary | ICD-10-CM | POA: Diagnosis not present

## 2018-10-08 DIAGNOSIS — C50919 Malignant neoplasm of unspecified site of unspecified female breast: Secondary | ICD-10-CM | POA: Diagnosis not present

## 2018-10-09 DIAGNOSIS — E872 Acidosis: Secondary | ICD-10-CM | POA: Diagnosis not present

## 2018-10-09 DIAGNOSIS — C50919 Malignant neoplasm of unspecified site of unspecified female breast: Secondary | ICD-10-CM | POA: Diagnosis not present

## 2018-10-09 DIAGNOSIS — I509 Heart failure, unspecified: Secondary | ICD-10-CM | POA: Diagnosis not present

## 2018-10-09 DIAGNOSIS — I420 Dilated cardiomyopathy: Secondary | ICD-10-CM | POA: Diagnosis not present

## 2018-10-09 DIAGNOSIS — I10 Essential (primary) hypertension: Secondary | ICD-10-CM | POA: Diagnosis not present

## 2018-10-09 DIAGNOSIS — I5022 Chronic systolic (congestive) heart failure: Secondary | ICD-10-CM | POA: Diagnosis not present

## 2018-10-13 DIAGNOSIS — I509 Heart failure, unspecified: Secondary | ICD-10-CM | POA: Diagnosis not present

## 2018-10-13 DIAGNOSIS — I420 Dilated cardiomyopathy: Secondary | ICD-10-CM | POA: Diagnosis not present

## 2018-10-13 DIAGNOSIS — E872 Acidosis: Secondary | ICD-10-CM | POA: Diagnosis not present

## 2018-10-13 DIAGNOSIS — C50919 Malignant neoplasm of unspecified site of unspecified female breast: Secondary | ICD-10-CM | POA: Diagnosis not present

## 2018-10-13 DIAGNOSIS — I10 Essential (primary) hypertension: Secondary | ICD-10-CM | POA: Diagnosis not present

## 2018-10-13 DIAGNOSIS — I5022 Chronic systolic (congestive) heart failure: Secondary | ICD-10-CM | POA: Diagnosis not present

## 2018-10-16 DIAGNOSIS — I420 Dilated cardiomyopathy: Secondary | ICD-10-CM | POA: Diagnosis not present

## 2018-10-16 DIAGNOSIS — I10 Essential (primary) hypertension: Secondary | ICD-10-CM | POA: Diagnosis not present

## 2018-10-16 DIAGNOSIS — E872 Acidosis: Secondary | ICD-10-CM | POA: Diagnosis not present

## 2018-10-16 DIAGNOSIS — I5022 Chronic systolic (congestive) heart failure: Secondary | ICD-10-CM | POA: Diagnosis not present

## 2018-10-16 DIAGNOSIS — C50919 Malignant neoplasm of unspecified site of unspecified female breast: Secondary | ICD-10-CM | POA: Diagnosis not present

## 2018-10-16 DIAGNOSIS — I509 Heart failure, unspecified: Secondary | ICD-10-CM | POA: Diagnosis not present

## 2018-10-18 DIAGNOSIS — I10 Essential (primary) hypertension: Secondary | ICD-10-CM | POA: Diagnosis not present

## 2018-10-18 DIAGNOSIS — I5022 Chronic systolic (congestive) heart failure: Secondary | ICD-10-CM | POA: Diagnosis not present

## 2018-10-18 DIAGNOSIS — E872 Acidosis: Secondary | ICD-10-CM | POA: Diagnosis not present

## 2018-10-18 DIAGNOSIS — I420 Dilated cardiomyopathy: Secondary | ICD-10-CM | POA: Diagnosis not present

## 2018-10-18 DIAGNOSIS — I509 Heart failure, unspecified: Secondary | ICD-10-CM | POA: Diagnosis not present

## 2018-10-18 DIAGNOSIS — C50919 Malignant neoplasm of unspecified site of unspecified female breast: Secondary | ICD-10-CM | POA: Diagnosis not present

## 2018-10-19 DIAGNOSIS — I10 Essential (primary) hypertension: Secondary | ICD-10-CM | POA: Diagnosis not present

## 2018-10-19 DIAGNOSIS — I420 Dilated cardiomyopathy: Secondary | ICD-10-CM | POA: Diagnosis not present

## 2018-10-19 DIAGNOSIS — I509 Heart failure, unspecified: Secondary | ICD-10-CM | POA: Diagnosis not present

## 2018-10-19 DIAGNOSIS — E872 Acidosis: Secondary | ICD-10-CM | POA: Diagnosis not present

## 2018-10-19 DIAGNOSIS — I5022 Chronic systolic (congestive) heart failure: Secondary | ICD-10-CM | POA: Diagnosis not present

## 2018-10-19 DIAGNOSIS — C50919 Malignant neoplasm of unspecified site of unspecified female breast: Secondary | ICD-10-CM | POA: Diagnosis not present

## 2018-10-26 ENCOUNTER — Encounter: Admit: 2018-10-26 | Discharge: 2018-10-26 | Payer: MEDICARE | Primary: Internal Medicine

## 2018-10-26 DIAGNOSIS — C7931 Secondary malignant neoplasm of brain: Secondary | ICD-10-CM | POA: Diagnosis not present

## 2018-10-26 DIAGNOSIS — C7949 Secondary malignant neoplasm of other parts of nervous system: Secondary | ICD-10-CM | POA: Diagnosis not present

## 2018-10-26 DIAGNOSIS — I509 Heart failure, unspecified: Secondary | ICD-10-CM | POA: Diagnosis not present

## 2018-10-26 DIAGNOSIS — I420 Dilated cardiomyopathy: Secondary | ICD-10-CM | POA: Diagnosis not present

## 2018-10-26 DIAGNOSIS — Z515 Encounter for palliative care: Secondary | ICD-10-CM | POA: Diagnosis not present

## 2018-10-26 DIAGNOSIS — I10 Essential (primary) hypertension: Secondary | ICD-10-CM | POA: Diagnosis not present

## 2018-10-26 DIAGNOSIS — C50911 Malignant neoplasm of unspecified site of right female breast: Secondary | ICD-10-CM | POA: Diagnosis not present

## 2018-10-27 ENCOUNTER — Encounter: Admit: 2018-10-27 | Discharge: 2018-10-27 | Payer: MEDICARE | Primary: Internal Medicine

## 2018-10-29 ENCOUNTER — Encounter: Admit: 2018-10-29 | Discharge: 2018-10-29 | Payer: MEDICARE | Primary: Internal Medicine

## 2018-10-29 DIAGNOSIS — C7931 Secondary malignant neoplasm of brain: Secondary | ICD-10-CM | POA: Diagnosis not present

## 2018-10-29 DIAGNOSIS — I509 Heart failure, unspecified: Secondary | ICD-10-CM | POA: Diagnosis not present

## 2018-10-29 DIAGNOSIS — C7949 Secondary malignant neoplasm of other parts of nervous system: Secondary | ICD-10-CM | POA: Diagnosis not present

## 2018-10-29 DIAGNOSIS — I420 Dilated cardiomyopathy: Secondary | ICD-10-CM | POA: Diagnosis not present

## 2018-10-29 DIAGNOSIS — I10 Essential (primary) hypertension: Secondary | ICD-10-CM | POA: Diagnosis not present

## 2018-10-29 DIAGNOSIS — C50911 Malignant neoplasm of unspecified site of right female breast: Secondary | ICD-10-CM | POA: Diagnosis not present

## 2018-11-05 ENCOUNTER — Encounter: Admit: 2018-11-05 | Discharge: 2018-11-05 | Payer: MEDICARE | Primary: Internal Medicine

## 2018-11-05 DIAGNOSIS — C7949 Secondary malignant neoplasm of other parts of nervous system: Secondary | ICD-10-CM | POA: Diagnosis not present

## 2018-11-05 DIAGNOSIS — I10 Essential (primary) hypertension: Secondary | ICD-10-CM | POA: Diagnosis not present

## 2018-11-05 DIAGNOSIS — C7931 Secondary malignant neoplasm of brain: Secondary | ICD-10-CM | POA: Diagnosis not present

## 2018-11-05 DIAGNOSIS — C50911 Malignant neoplasm of unspecified site of right female breast: Secondary | ICD-10-CM | POA: Diagnosis not present

## 2018-11-05 DIAGNOSIS — I509 Heart failure, unspecified: Secondary | ICD-10-CM | POA: Diagnosis not present

## 2018-11-05 DIAGNOSIS — I420 Dilated cardiomyopathy: Secondary | ICD-10-CM | POA: Diagnosis not present

## 2018-11-05 MED ADMIN — Medication: ORAL | @ 14:00:00 | NDC 97807001067

## 2018-11-05 MED ADMIN — Medication: ORAL | @ 14:00:00 | NDC 70408035935

## 2018-11-07 ENCOUNTER — Encounter: Admit: 2018-11-07 | Discharge: 2018-11-07 | Payer: MEDICARE | Primary: Internal Medicine

## 2018-11-07 DIAGNOSIS — C7931 Secondary malignant neoplasm of brain: Secondary | ICD-10-CM | POA: Diagnosis not present

## 2018-11-07 DIAGNOSIS — I509 Heart failure, unspecified: Secondary | ICD-10-CM | POA: Diagnosis not present

## 2018-11-07 DIAGNOSIS — I10 Essential (primary) hypertension: Secondary | ICD-10-CM | POA: Diagnosis not present

## 2018-11-07 DIAGNOSIS — C7949 Secondary malignant neoplasm of other parts of nervous system: Secondary | ICD-10-CM | POA: Diagnosis not present

## 2018-11-07 DIAGNOSIS — C50911 Malignant neoplasm of unspecified site of right female breast: Secondary | ICD-10-CM | POA: Diagnosis not present

## 2018-11-07 DIAGNOSIS — I420 Dilated cardiomyopathy: Secondary | ICD-10-CM | POA: Diagnosis not present

## 2018-11-09 ENCOUNTER — Encounter: Admit: 2018-11-09 | Discharge: 2018-11-09 | Payer: MEDICARE | Primary: Internal Medicine

## 2018-11-09 DIAGNOSIS — I420 Dilated cardiomyopathy: Secondary | ICD-10-CM | POA: Diagnosis not present

## 2018-11-09 DIAGNOSIS — C7949 Secondary malignant neoplasm of other parts of nervous system: Secondary | ICD-10-CM | POA: Diagnosis not present

## 2018-11-09 DIAGNOSIS — C50911 Malignant neoplasm of unspecified site of right female breast: Secondary | ICD-10-CM | POA: Diagnosis not present

## 2018-11-09 DIAGNOSIS — C7931 Secondary malignant neoplasm of brain: Secondary | ICD-10-CM | POA: Diagnosis not present

## 2018-11-09 DIAGNOSIS — I10 Essential (primary) hypertension: Secondary | ICD-10-CM | POA: Diagnosis not present

## 2018-11-09 DIAGNOSIS — I509 Heart failure, unspecified: Secondary | ICD-10-CM | POA: Diagnosis not present

## 2018-11-10 ENCOUNTER — Encounter: Admit: 2018-11-10 | Discharge: 2018-11-10 | Payer: MEDICARE | Primary: Internal Medicine

## 2018-11-10 DIAGNOSIS — I420 Dilated cardiomyopathy: Secondary | ICD-10-CM | POA: Diagnosis not present

## 2018-11-10 DIAGNOSIS — I509 Heart failure, unspecified: Secondary | ICD-10-CM | POA: Diagnosis not present

## 2018-11-10 DIAGNOSIS — I10 Essential (primary) hypertension: Secondary | ICD-10-CM | POA: Diagnosis not present

## 2018-11-10 DIAGNOSIS — C7931 Secondary malignant neoplasm of brain: Secondary | ICD-10-CM | POA: Diagnosis not present

## 2018-11-10 DIAGNOSIS — C50911 Malignant neoplasm of unspecified site of right female breast: Secondary | ICD-10-CM | POA: Diagnosis not present

## 2018-11-10 DIAGNOSIS — C7949 Secondary malignant neoplasm of other parts of nervous system: Secondary | ICD-10-CM | POA: Diagnosis not present

## 2018-11-12 ENCOUNTER — Encounter: Primary: Internal Medicine

## 2018-11-14 ENCOUNTER — Encounter: Primary: Internal Medicine

## 2018-11-15 DEATH — deceased

## 2018-11-19 ENCOUNTER — Encounter: Primary: Internal Medicine

## 2018-11-21 ENCOUNTER — Encounter: Primary: Internal Medicine

## 2018-11-26 ENCOUNTER — Encounter: Primary: Internal Medicine

## 2018-11-28 ENCOUNTER — Encounter: Primary: Internal Medicine

## 2018-12-03 ENCOUNTER — Encounter: Primary: Internal Medicine

## 2018-12-05 ENCOUNTER — Encounter: Primary: Internal Medicine

## 2019-01-25 IMAGING — CT CT ANGIO CHEST
2 of 11 series · 17 of 36 positions shown · IV contrast (OMNI)
Comparison: Radiograph 04/04/2017

ADDENDUM:
Lucent and sclerotic appearance of the right first rib, may reflect
additional osseous metastatic focus.
CLINICAL DATA: Shortness of breath for 1 day

EXAM:
CT ANGIOGRAPHY CHEST WITH CONTRAST
TECHNIQUE: Multidetector CT imaging of the chest was performed using the
standard protocol during bolus administration of intravenous
contrast. Multiplanar CT image reconstructions and MIPs were
obtained to evaluate the vascular anatomy.
CONTRAST:  110 mL Isovue 370 intravenous

[Series 11: thins · axial · 0.68mm/px · z∈[-18,+255]mm · 16 of 311 slices shown]
[im 19/311  lung]
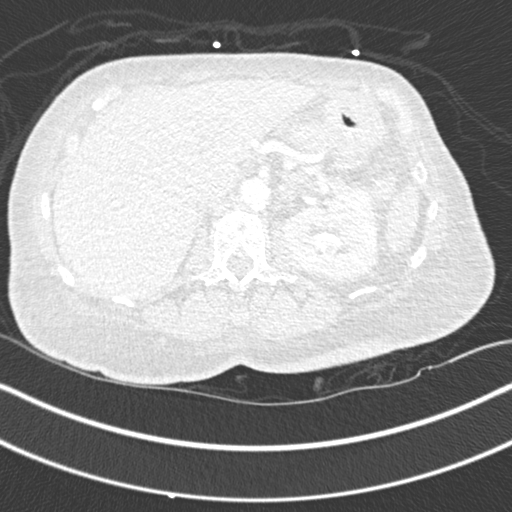
[im 37/311  mediastinal]
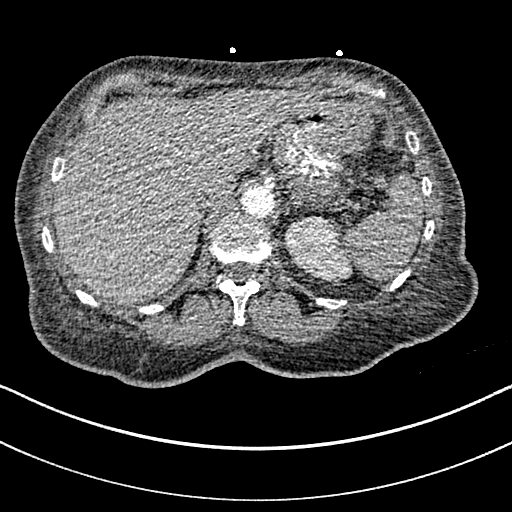
[im 55/311  lung]
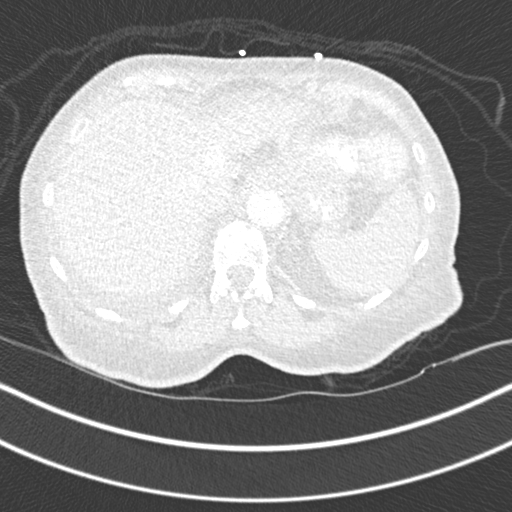
[im 73/311  mediastinal]
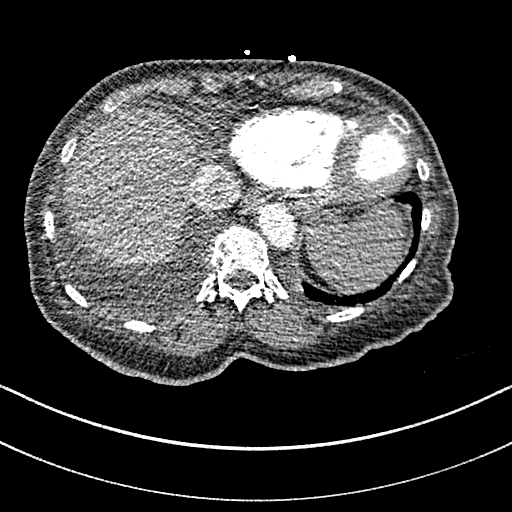
[im 92/311  lung]
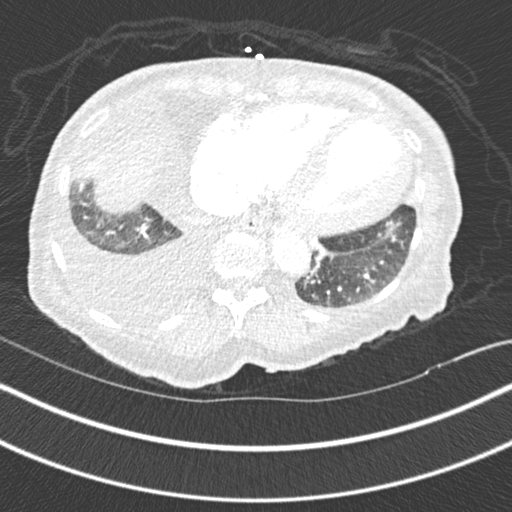
[im 110/311  mediastinal]
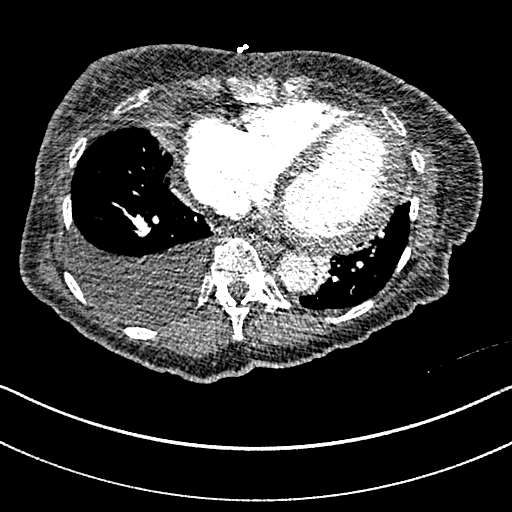
[im 128/311  lung]
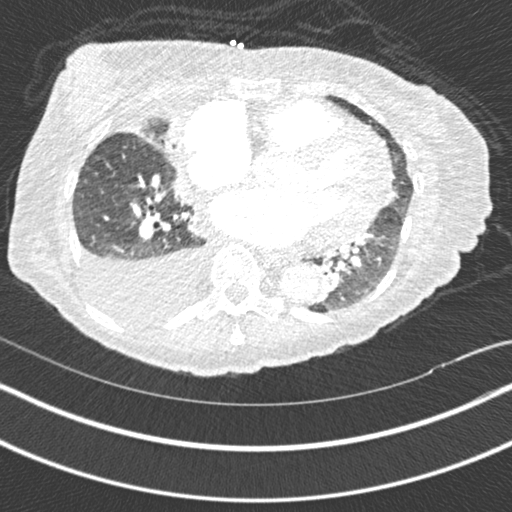
[im 146/311  mediastinal]
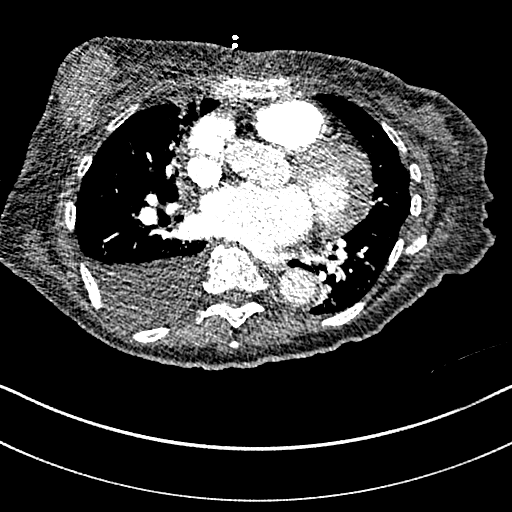
[im 165/311  lung]
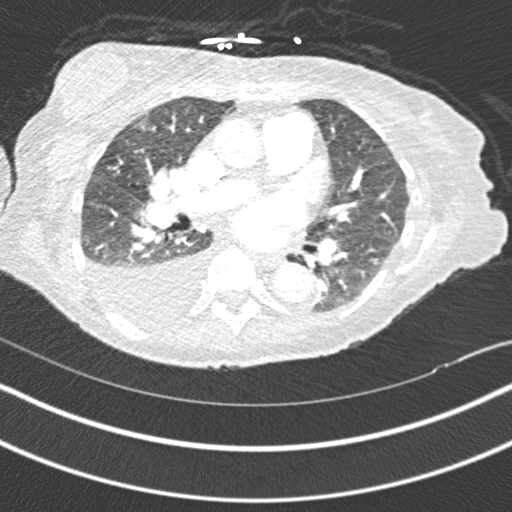
[im 183/311  mediastinal]
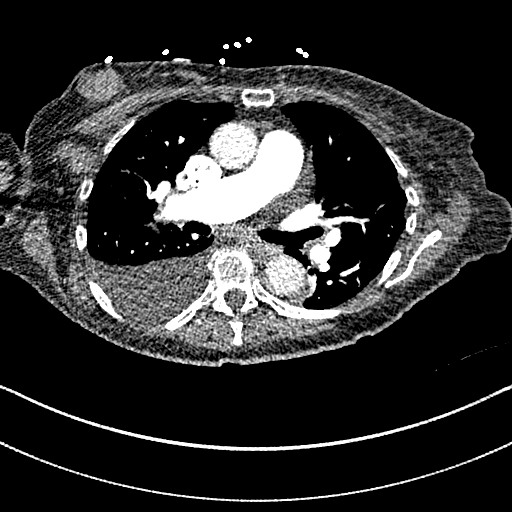
[im 201/311  lung]
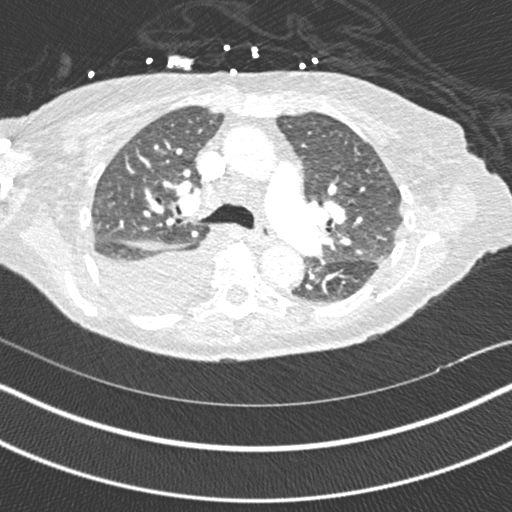
[im 219/311  mediastinal]
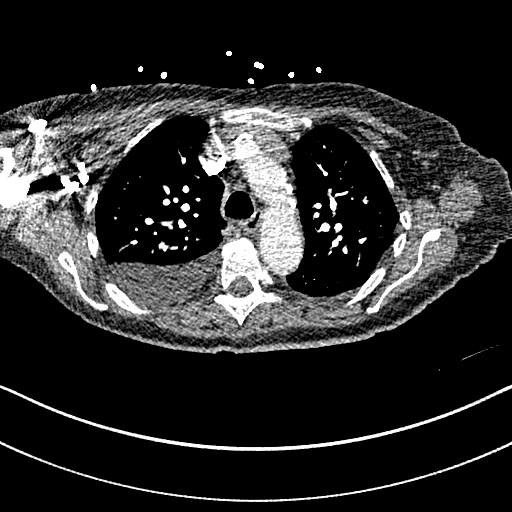
[im 238/311  lung]
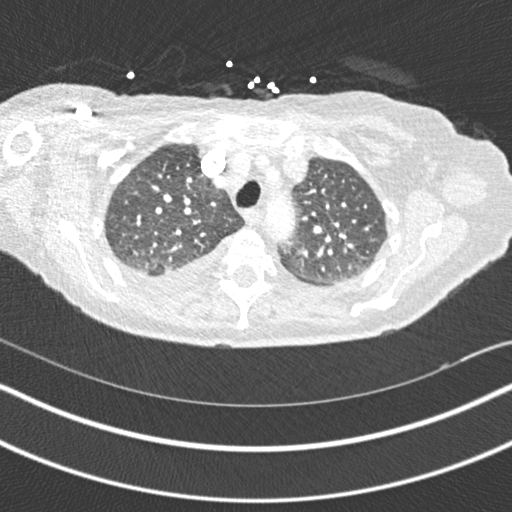
[im 256/311  mediastinal]
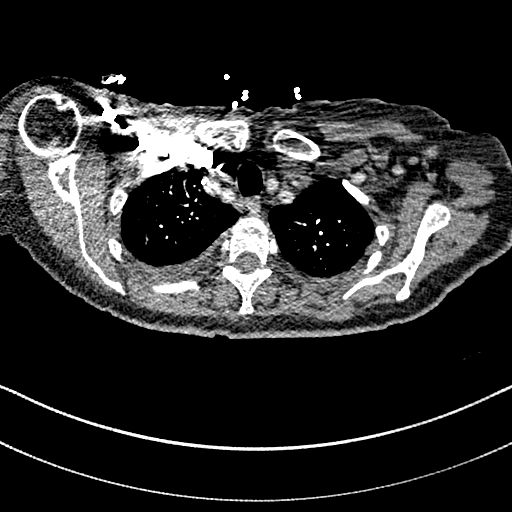
[im 274/311  lung]
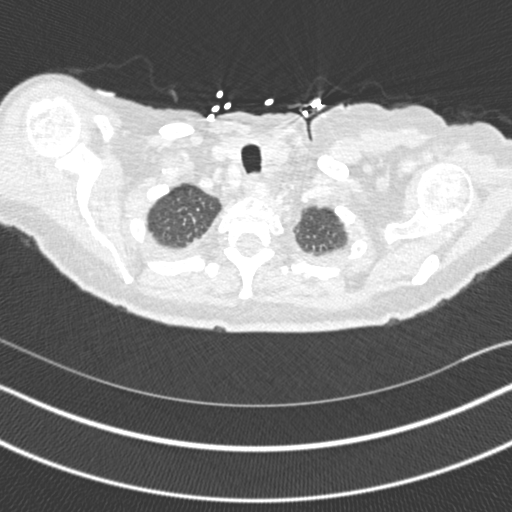
[im 292/311  mediastinal]
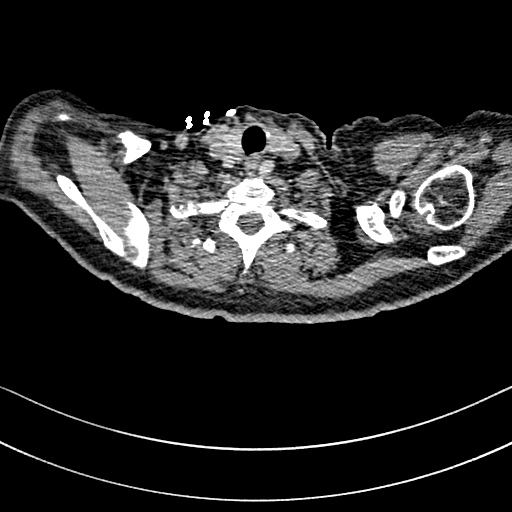

[Series 13: coronal mpr · coronal · 0.61mm/px · 1 of 113 slices shown]
[im 57/113  mediastinal]
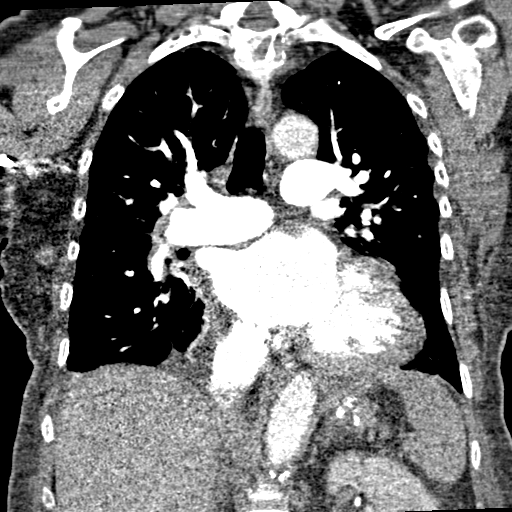

[17 of 36 positions shown; findings below may reference images not displayed]

FINDINGS: Cardiovascular: Satisfactory opacification of the pulmonary arteries
to the segmental level. No evidence of pulmonary embolism.
Nonaneurysmal aorta. Moderate aortic atherosclerosis. Mild coronary
artery calcification. Cardiomegaly with trace pericardial effusion.

Mediastinum/Nodes: Midline trachea. No thyroid mass. Enlarged
mediastinal lymph nodes. Right pretracheal lymph node measures 12
mm. Right precarinal lymph node measures 16 mm. Esophagus within
normal limits. Multiple enlarged right axillary and subpectoral
lymph nodes, measuring up to 3.5 cm.

Lungs/Pleura: Moderate right-sided pleural effusion. Mild
atelectasis in the right middle lobe and right lung base. No
pneumothorax.

Upper Abdomen: Partially visualized hypodensity in the posterior
right hepatic lobe. Adrenal glands are within normal limits.

Musculoskeletal:

Large sub areolar mass in the right breast with suggestion of nipple
retraction.Mass measures approximately 5.6 x 2.9 by 7.2 cm. Bones
demonstrate lucent lesion in the T1 vertebral body, mixed lucency
and sclerosis at T2, T8, T11, and T12.

Review of the MIP images confirms the above findings.
IMPRESSION: 1. Negative for acute pulmonary embolus. Cardiomegaly with moderate
right-sided pleural effusion.
2. Large right subareolar breast mass concerning for breast
carcinoma. Overlying nipple retraction. Multiple enlarged right
axillary and subpectoral lymph nodes concerning for metastatic
disease.
3. Enlarged mediastinal lymph nodes, suspect for metastatic disease
4. Multiple lucent and mixed lucent and sclerotic lesions within the
spine as above concerning for osseous metastatic disease.

Critical Value/emergent results were called by telephone at the time
of interpretation on 04/04/2017 at [DATE] to Dr. FERDJALLAH RASIM ,
who verbally acknowledged these results.

Aortic Atherosclerosis ([id number].[id number]).

## 2019-02-11 IMAGING — CR DG CHEST 2V
3 series · 3 of 3 positions shown · non-contrast
Comparison: 04/04/2017 chest radiograph and CT.

CLINICAL DATA: Low oxygen saturation.

EXAM:
CHEST - 2 VIEW

[w chest lat (1 of 2)]
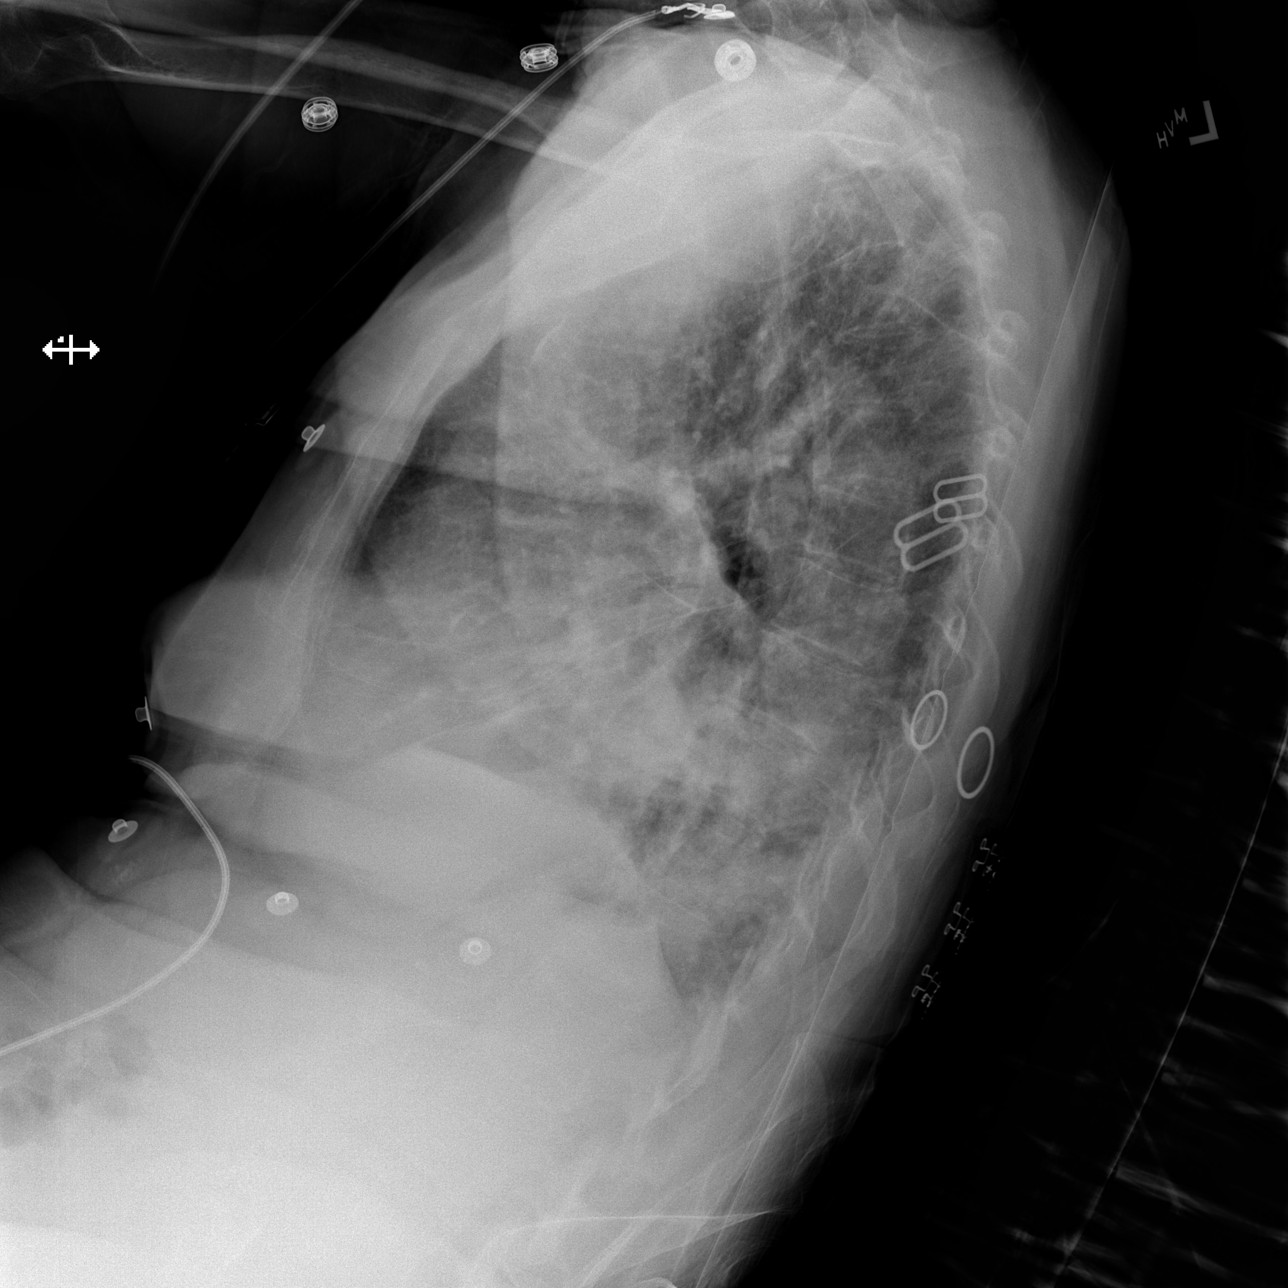

[w chest lat (2 of 2)]
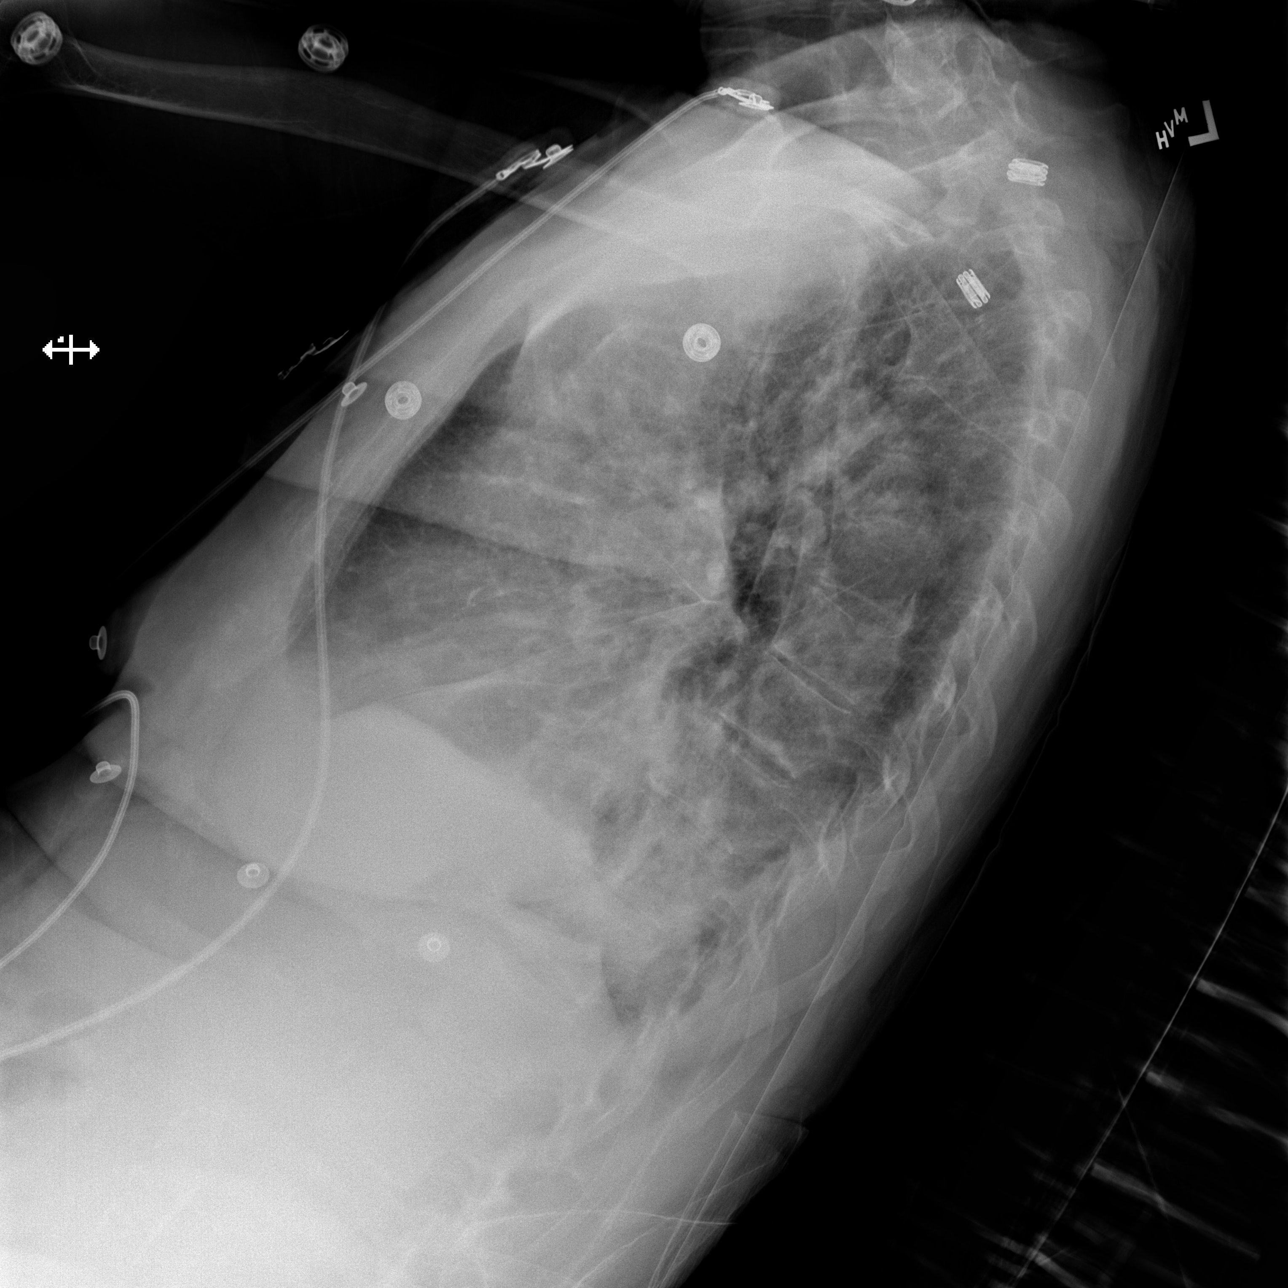

[x chest ap]
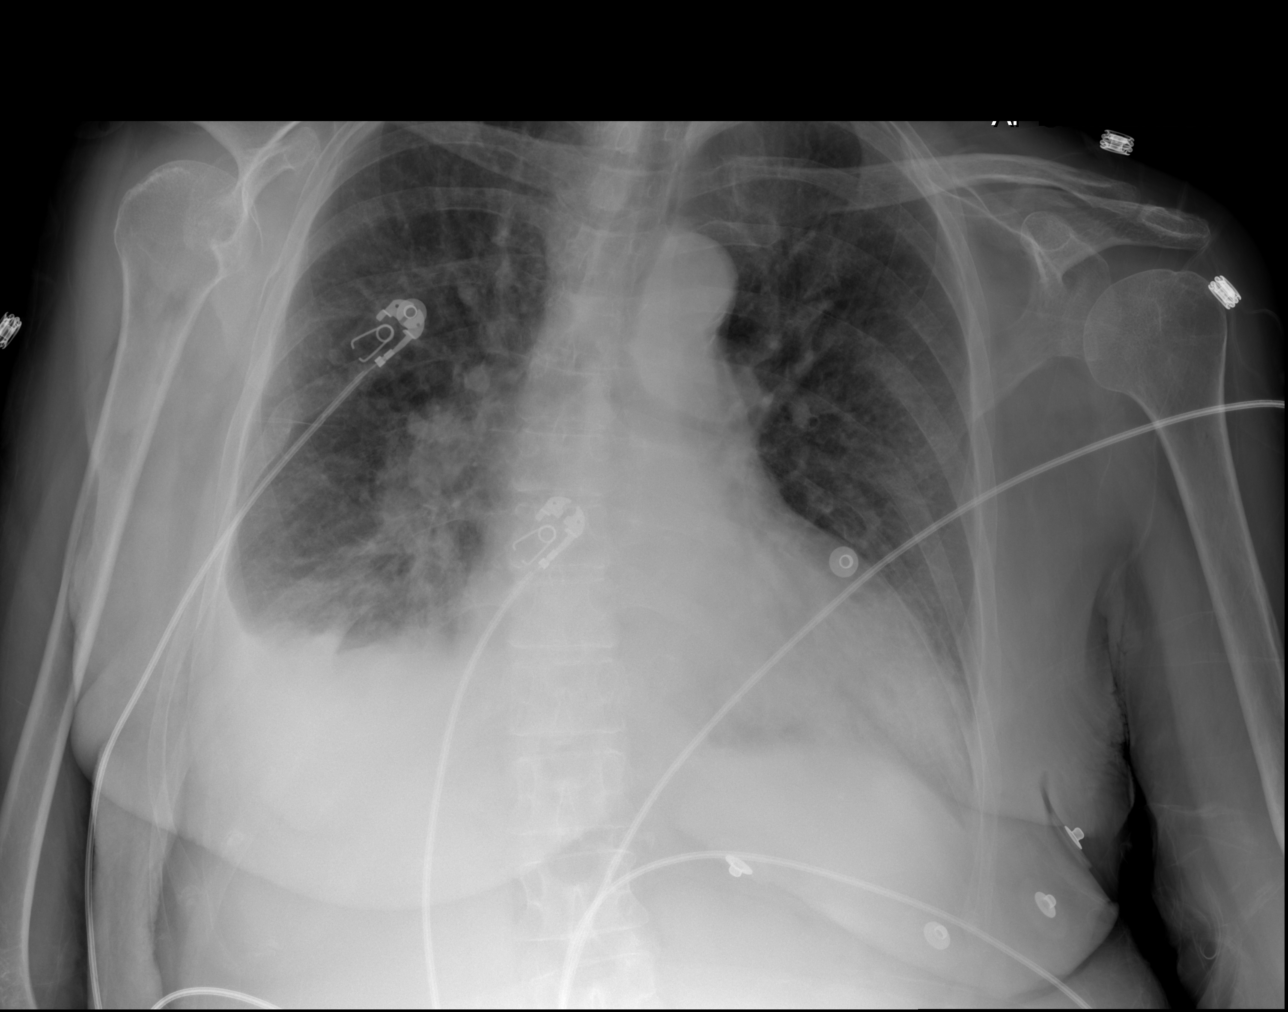

[3 of 3 positions shown; findings below may reference images not displayed]

FINDINGS: Stable fullness in the right hilum. Persistent right basilar
densities compatible with pleural fluid and volume
loss/consolidation. Heart size remains enlarged. No significant
airspace disease or consolidation in the left lung. Atherosclerotic
calcifications at the aortic arch. Old right seventh rib fracture.
IMPRESSION: Persistent pleural and parenchymal densities in the right lower
chest. Findings are compatible with right pleural effusion and
consolidation/volume loss. Findings are similar to the previous
examination.
